# Patient Record
Sex: Male | Born: 1945 | Hispanic: No | Smoking: Never smoker
Health system: Southern US, Community
[De-identification: ages and names within clinical notes are randomized; demographics above are authoritative.]

## PROBLEM LIST (undated history)

## (undated) DIAGNOSIS — K219 Gastro-esophageal reflux disease without esophagitis: Secondary | ICD-10-CM

## (undated) DIAGNOSIS — I10 Essential (primary) hypertension: Secondary | ICD-10-CM

## (undated) DIAGNOSIS — T7840XA Allergy, unspecified, initial encounter: Secondary | ICD-10-CM

## (undated) HISTORY — DX: Allergy, unspecified, initial encounter: T78.40XA

## (undated) HISTORY — PX: OTHER SURGICAL HISTORY: SHX169

## (undated) HISTORY — PX: SHOULDER SURGERY: SHX246

## (undated) HISTORY — DX: Essential (primary) hypertension: I10

## (undated) HISTORY — DX: Gastro-esophageal reflux disease without esophagitis: K21.9

---

## 2005-12-21 ENCOUNTER — Ambulatory Visit (HOSPITAL_COMMUNITY): Admission: RE | Admit: 2005-12-21 | Discharge: 2005-12-22 | Payer: Self-pay | Admitting: Orthopedic Surgery

## 2006-05-12 ENCOUNTER — Ambulatory Visit (HOSPITAL_COMMUNITY): Admission: RE | Admit: 2006-05-12 | Discharge: 2006-05-12 | Payer: Self-pay | Admitting: Orthopedic Surgery

## 2011-08-19 ENCOUNTER — Ambulatory Visit: Payer: BC Managed Care – PPO

## 2011-08-19 ENCOUNTER — Ambulatory Visit (INDEPENDENT_AMBULATORY_CARE_PROVIDER_SITE_OTHER): Payer: BC Managed Care – PPO | Admitting: Internal Medicine

## 2011-08-19 VITALS — BP 175/91 | HR 76 | Temp 97.9°F | Resp 18 | Ht 64.5 in | Wt 199.0 lb

## 2011-08-19 DIAGNOSIS — M79609 Pain in unspecified limb: Secondary | ICD-10-CM

## 2011-08-19 DIAGNOSIS — S638X1A Sprain of other part of right wrist and hand, initial encounter: Secondary | ICD-10-CM

## 2011-08-19 DIAGNOSIS — M79641 Pain in right hand: Secondary | ICD-10-CM

## 2011-08-19 NOTE — Patient Instructions (Signed)
Splint ice elevate advil as directed

## 2011-08-19 NOTE — Progress Notes (Signed)
  Subjective:    Patient ID: John Stewart, male    DOB: 1945-12-24, 66 y.o.   MRN: [id number]  HPI Pain 5th digit and hand on the right side Onset yesterday Pulled by dog when he had the leash wrapped around his hand Pain 8/10 mod to severe radiates to hte right hand Minimal relief with ice and elevation Increase pain with range of motion    Review of Systems  Unable to perform ROS Constitutional: Negative.   HENT: Negative.   Eyes: Negative.   Respiratory: Negative.   Cardiovascular: Negative.   Gastrointestinal: Negative.   Genitourinary: Negative.   Musculoskeletal: Positive for joint swelling.       5th digit of the right hand  Skin: Negative.   Neurological: Negative.   Hematological: Negative.   Psychiatric/Behavioral: Negative.   All other systems reviewed and are negative.       Objective:   Physical Exam  Constitutional: He is oriented to person, place, and time. He appears well-developed and well-nourished.  HENT:  Head: Normocephalic and atraumatic.  Eyes: Conjunctivae and EOM are normal. Pupils are equal, round, and reactive to light.  Neck: Normal range of motion. Neck supple.  Cardiovascular: Normal rate, regular rhythm and normal heart sounds.   Pulmonary/Chest: Effort normal and breath sounds normal.  Abdominal: Soft.  Musculoskeletal: He exhibits tenderness.       Tender swelling mc-p joint o the 5th digit of the right hand Pain with motion.  Neurological: He is alert and oriented to person, place, and time.  Skin: Skin is warm and dry.  Psychiatric: He has a normal mood and affect. His behavior is normal. Judgment and thought content normal.      [facility] reading (PRIMARY) by  Dr. Mindi Junker .right hand xray negitive for fracture or dislocation     Assessment & Plan:  Will need to do xray of hte right hand to eval for possible fracture or dislocation.  Will buddy tape and splint rx antiinflammatory otc

## 2012-09-29 ENCOUNTER — Ambulatory Visit (INDEPENDENT_AMBULATORY_CARE_PROVIDER_SITE_OTHER): Payer: PRIVATE HEALTH INSURANCE | Admitting: Family Medicine

## 2012-09-29 VITALS — BP 142/90 | HR 80 | Temp 98.0°F | Resp 16 | Ht 65.5 in | Wt 214.0 lb

## 2012-09-29 DIAGNOSIS — R05 Cough: Secondary | ICD-10-CM

## 2012-09-29 DIAGNOSIS — T148 Other injury of unspecified body region: Secondary | ICD-10-CM

## 2012-09-29 DIAGNOSIS — W57XXXA Bitten or stung by nonvenomous insect and other nonvenomous arthropods, initial encounter: Secondary | ICD-10-CM

## 2012-09-29 DIAGNOSIS — R059 Cough, unspecified: Secondary | ICD-10-CM

## 2012-09-29 MED ORDER — HYDROCODONE-HOMATROPINE 5-1.5 MG/5ML PO SYRP: 5.0000 mL | ORAL_SOLUTION | ORAL | Status: DC | PRN

## 2012-09-29 MED ORDER — DOXYCYCLINE HYCLATE 100 MG PO TABS: 100.0000 mg | ORAL_TABLET | Freq: Two times a day (BID) | ORAL | Status: DC

## 2012-09-29 MED ORDER — BENZONATATE 100 MG PO CAPS: ORAL_CAPSULE | ORAL | Status: DC

## 2012-09-29 NOTE — Patient Instructions (Addendum)
Drink plenty of fluids  Get sufficient rest  Take the cough pills in the daytime and the cough syrup at night  Take the doxycycline one twice daily for a full 10 days for the tick bite and for the respiratory infection. Be cautious because on doxycycline you may sunburn easier. Return if worse

## 2012-09-29 NOTE — Progress Notes (Signed)
Subjective: Patient has had a respiratory tract infection since about Sunday or Monday. He has had cough with mild phlegm production. He's had some sore throat. The head is being congested also. His daughter has been ill, and his wife now has a little throat discomfort yesterday. He works at [employer]. He does not smoke. He rarely has to go see the doctor.  About 3 days ago he had a tick on his left upper thigh a in the crease of the groin. His wife removed it with him fingernail polish remover and then placing it out. He has a area of erythema about the shape of an egg where the tick was removed. He says it was a little brown tick.  Objective: Pleasant gentleman in no major distress. His TMs are normal. Throat clear. Neck supple without significant nodes. Chest is clear to auscultation. Heart regular without murmurs. He has an area of erythema from the tick bite on the left groin at the upper thigh.  Assessment: Tick bite with local cellulitis, too soon for other characteristic diseases  URI and bronchitis  Plan: Doxycycline 100 mg twice a day Tessalon and Hycodan Return for any further problem

## 2013-02-13 ENCOUNTER — Ambulatory Visit (INDEPENDENT_AMBULATORY_CARE_PROVIDER_SITE_OTHER): Payer: Medicare PPO | Admitting: Family Medicine

## 2013-02-13 VITALS — BP 132/90 | HR 78 | Temp 98.3°F | Resp 16 | Ht 65.5 in | Wt 218.4 lb

## 2013-02-13 DIAGNOSIS — Z23 Encounter for immunization: Secondary | ICD-10-CM

## 2013-02-13 DIAGNOSIS — K219 Gastro-esophageal reflux disease without esophagitis: Secondary | ICD-10-CM

## 2013-02-13 DIAGNOSIS — I10 Essential (primary) hypertension: Secondary | ICD-10-CM

## 2013-02-13 LAB — POCT UA - MICROSCOPIC ONLY
Bacteria, U Microscopic: NEGATIVE
Casts, Ur, LPF, POC: NEGATIVE
Crystals, Ur, HPF, POC: NEGATIVE
Epithelial cells, urine per micros: NEGATIVE
Yeast, UA: NEGATIVE

## 2013-02-13 LAB — BASIC METABOLIC PANEL
CO2: 24 mEq/L (ref 19–32)
Calcium: 9 mg/dL (ref 8.4–10.5)
Chloride: 105 mEq/L (ref 96–112)
Potassium: 3.8 mEq/L (ref 3.5–5.3)
Sodium: 139 mEq/L (ref 135–145)

## 2013-02-13 LAB — POCT URINALYSIS DIPSTICK
Bilirubin, UA: NEGATIVE
Glucose, UA: NEGATIVE
Ketones, UA: NEGATIVE
Leukocytes, UA: NEGATIVE
Nitrite, UA: NEGATIVE
Protein, UA: NEGATIVE
Spec Grav, UA: 1.015
Urobilinogen, UA: 0.2
pH, UA: 5.5

## 2013-02-13 LAB — BASIC METABOLIC PANEL WITH GFR
BUN: 10 mg/dL (ref 6–23)
Creat: 0.74 mg/dL (ref 0.50–1.35)
Glucose, Bld: 93 mg/dL (ref 70–99)

## 2013-02-13 MED ORDER — LISINOPRIL 40 MG PO TABS: 40.0000 mg | ORAL_TABLET | Freq: Every day | ORAL | Status: DC

## 2013-02-13 MED ORDER — OMEPRAZOLE 20 MG PO CPDR: 20.0000 mg | DELAYED_RELEASE_CAPSULE | Freq: Every day | ORAL | Status: DC

## 2013-02-13 MED ORDER — MUPIROCIN CALCIUM 2 % EX CREA: TOPICAL_CREAM | Freq: Two times a day (BID) | CUTANEOUS | Status: DC

## 2013-02-13 NOTE — Progress Notes (Signed)
[facility]:  Office Visit  Chief Complaint:  Chief Complaint  Patient presents with  . Medication Refills    Lisinopril and Omeprazole  . Rash    Lower left leg, X1-2 months    HPI: John Stewart is a 67 y.o. male who is here for:  1. HTN-dx 1-1.5 years ago, SBP 150s/90-100 but normally runs at 120/80s when on meds. He retired from [employer] and had 1 months worth of supply until Medicare kicked in, so he has been stretching out every other day.  He was trained as an Event organiser, prior to working at [employer]. He was there for 17 years. He was a Runner, broadcasting/film/video and AT for 22 years. No diabetes or high cholesterol that he is aware of. He has been on lisinopril without SEs  2. He has also a 2 week history of a rash on his left leg, he thinks it mught be from working out in the yard until he scratched it and it got more infected and  Has not healed. He denies Dm   Past Medical History  Diagnosis Date  . Hypertension    Past Surgical History  Procedure Laterality Date  . Biceps tendon surgery Right    History   Social History  . Marital Status: Married    Spouse Name: N/A    Number of Children: N/A  . Years of Education: N/A   Social History Main Topics  . Smoking status: Never Smoker   . Smokeless tobacco: None  . Alcohol Use: None  . Drug Use: None  . Sexual Activity: None   Other Topics Concern  . None   Social History Narrative  . None   History reviewed. No pertinent family history. No Known Allergies Prior to Admission medications   Medication Sig Start Date End Date Taking? Authorizing Provider  lisinopril (PRINIVIL,ZESTRIL) 40 MG tablet Take 40 mg by mouth daily.   Yes Historical Provider, MD  omeprazole (PRILOSEC) 20 MG capsule Take 20 mg by mouth daily.   Yes Historical Provider, MD  benzonatate (TESSALON) 100 MG capsule Use 1-2 tablets 3 times daily as necessary for cough. May be used with other cough medicines if needed.  09/29/12   Peyton Najjar, MD  doxycycline (VIBRA-TABS) 100 MG tablet Take 1 tablet (100 mg total) by mouth 2 (two) times daily. 09/29/12   Peyton Najjar, MD  HYDROcodone-homatropine [facility]) 5-1.5 MG/5ML syrup Take 5 mLs by mouth every 4 (four) hours as needed for cough. 09/29/12   Peyton Najjar, MD     ROS: The patient denies fevers, chills, night sweats, unintentional weight loss, chest pain, palpitations, wheezing, dyspnea on exertion, nausea, vomiting, abdominal pain, dysuria, hematuria, melena, numbness, weakness, or tingling.   All other systems have been reviewed and were otherwise negative with the exception of those mentioned in the HPI and as above.    PHYSICAL EXAM: Filed Vitals:   02/13/13 1503  BP: 132/90  Pulse: 78  Temp: 98.3 F (36.8 C)  Resp: 16   Filed Vitals:   02/13/13 1503  Height: 5' 5.5" (1.664 m)  Weight: 218 lb 6.4 oz (99.066 kg)   Body mass index is 35.78 kg/(m^2).  General: Alert, no acute distress HEENT:  Normocephalic, atraumatic, oropharynx patent. EOMI, PERRLA Cardiovascular:  Regular rate and rhythm, no rubs murmurs or gallops.  No Carotid bruits, radial pulse intact. No pedal edema.  Respiratory: Clear to auscultation bilaterally.  No wheezes, rales, or rhonchi.  No  cyanosis, no use of accessory musculature GI: No organomegaly, abdomen is soft and non-tender, positive bowel sounds.  No masses. Skin: + excoriated macular rash x 2 lesions on left anterior shin Lower leg Neurologic: Facial musculature symmetric. Psychiatric: Patient is appropriate throughout our interaction. Lymphatic: No cervical lymphadenopathy Musculoskeletal: Gait intact.   LABS: Results for orders placed in visit on 02/13/13  BASIC METABOLIC PANEL      Result Value Range   Sodium 139  135 - 145 mEq/L   Potassium 3.8  3.5 - 5.3 mEq/L   Chloride 105  96 - 112 mEq/L   CO2 24  19 - 32 mEq/L   Glucose, Bld 93  70 - 99 mg/dL   BUN 10  6 - 23 mg/dL   Creat 2.13  0.86 - 5.78  mg/dL   Calcium 9.0  8.4 - 46.9 mg/dL  POCT UA - MICROSCOPIC ONLY      Result Value Range   WBC, Ur, HPF, POC 0-1     RBC, urine, microscopic 0-2     Bacteria, U Microscopic neg     Mucus, UA trace     Epithelial cells, urine per micros neg     Crystals, Ur, HPF, POC neg     Casts, Ur, LPF, POC neg     Yeast, UA neg    POCT URINALYSIS DIPSTICK      Result Value Range   Color, UA yellow     Clarity, UA clear     Glucose, UA neg     Bilirubin, UA neg     Ketones, UA neg     Spec Grav, UA 1.015     Blood, UA small     pH, UA 5.5     Protein, UA neg     Urobilinogen, UA 0.2     Nitrite, UA neg     Leukocytes, UA Negative       EKG/XRAY:   Primary read interpreted by Dr. Conley Rolls at [facility].   ASSESSMENT/PLAN: Encounter Diagnoses  Name Primary?  . HTN (hypertension) Yes  . GERD (gastroesophageal reflux disease)   . Flu vaccine need    Topical bactroban cream BID and wound care as directed Flu vaccine given  Refilled meds for HTN and GERD: Lisinopril 40 mg daily, Prilosec daily F/u in 6 months, annual exam, with Drs Clelia Croft, Neva Seat or [facility] pending Gross sideeffects, risk and benefits, and alternatives of medications d/w patient. Patient is aware that all medications have potential sideeffects and we are unable to predict every sideeffect or drug-drug interaction that may occur.  LE, THAO PHUONG, DO 02/14/2013 1:23 PM

## 2014-01-15 ENCOUNTER — Other Ambulatory Visit: Payer: Self-pay | Admitting: Family Medicine

## 2014-01-16 ENCOUNTER — Ambulatory Visit (INDEPENDENT_AMBULATORY_CARE_PROVIDER_SITE_OTHER): Payer: Medicare PPO | Admitting: Family Medicine

## 2014-01-16 VITALS — BP 142/78 | HR 81 | Temp 98.1°F | Resp 18 | Ht 65.0 in | Wt 217.0 lb

## 2014-01-16 DIAGNOSIS — S81802A Unspecified open wound, left lower leg, initial encounter: Secondary | ICD-10-CM

## 2014-01-16 DIAGNOSIS — I10 Essential (primary) hypertension: Secondary | ICD-10-CM

## 2014-01-16 MED ORDER — MUPIROCIN CALCIUM 2 % EX CREA: TOPICAL_CREAM | Freq: Three times a day (TID) | CUTANEOUS | Status: DC

## 2014-01-16 MED ORDER — LISINOPRIL 40 MG PO TABS: 40.0000 mg | ORAL_TABLET | Freq: Every day | ORAL | Status: DC

## 2014-01-16 NOTE — Progress Notes (Signed)
Subjective:    Patient ID: John Stewart, male    DOB: June 13, 1945, 68 y.o.   MRN: [id number]  This chart was scribed for John Agreste, MD at [facility] by Rayfield Citizen, medical scribe. This patient was seen in room Room 11 and the patient's care was started at 4:44 PM.   HPI  HPI Comments: John Stewart is a 68 y.o. male who presents to the [facility] for medication refill; last seen by Dr. Marin Comment November of 2014. Medication was prescribed then with plan of follow up in 6 mos. with Dr. Carlota Raspberry, Dr. Leward Quan, or Dr. Brigitte Pulse. Normal electrolytes at that time with creatinine 0.74. He had his omeprazole for heartburn refilled at that time as well.   He would like his blood pressure medication refilled today; he received a call from [facility] to pick up his blood pressure meds. He then went to [facility] and was told that an MD had to call to have it refilled, prompting him to come to [facility] this afternoon. He states that he has been taking this medication off and on; he took his last dose yesterday (40mg ) and his blood pressure was slightly elevated today (142/78). He has a blood pressure cuff at home and this number is normal for him (140s/70s sometimes as high as 90s). He estimates that he misses his medications 3-4x a month. He denies chest pain or SOB, denies any abdominal pain or blood in the stool. He denies any side effects from his medication. He does not exercise regularly; he walks the dog .5 mi.   He also notes some rash/skin breakdown on his left shin for the past three months. He has been utilizing OTC antibiotic and anti-itch ointments with minimal relief, though he reports that it is gradually improving. He denies any insect bites but notes that he does garden occasionally. He reports that he does scratch at the area.    He does not have a PCP; he normally sees a physician at [facility]. He is amenable to scheduling a physical.   He is recently retired.    There are no active problems to display for this patient.  Past Medical History  Diagnosis Date  . Hypertension    Past Surgical History  Procedure Laterality Date  . Biceps tendon surgery Right    No Known Allergies Prior to Admission medications   Medication Sig Start Date End Date Taking? Authorizing Provider  lisinopril (PRINIVIL,ZESTRIL) 40 MG tablet Take 1 tablet (40 mg total) by mouth daily. PATIENT NEEDS AN OFFICE VISIT FOR ADDITIONAL REFILLS. 01/15/14  Yes Thao P Le, DO  omeprazole (PRILOSEC) 20 MG capsule Take 1 capsule (20 mg total) by mouth daily. 02/13/13  Yes Thao P Le, DO   History   Social History  . Marital Status: Married    Spouse Name: N/A    Number of Children: N/A  . Years of Education: N/A   Occupational History  . Not on file.   Social History Main Topics  . Smoking status: Never Smoker   . Smokeless tobacco: Not on file  . Alcohol Use: Not on file  . Drug Use: Not on file  . Sexual Activity: Not on file   Other Topics Concern  . Not on file   Social History Narrative  . No narrative on file    Review of Systems  Constitutional: Negative for fatigue and unexpected weight change.  Eyes: Negative for visual disturbance.  Respiratory: Negative for cough, chest tightness and shortness of breath.   Cardiovascular: Negative for chest pain, palpitations and leg swelling.  Gastrointestinal: Negative for abdominal pain and blood in stool.  Skin: Positive for rash.  Neurological: Negative for dizziness, light-headedness and headaches.       Objective:   Physical Exam  Vitals reviewed. Constitutional: He is oriented to person, place, and time. He appears well-developed and well-nourished.  HENT:  Head: Normocephalic and atraumatic.  Eyes: EOM are normal. Pupils are equal, round, and reactive to light.  Neck: No JVD present. Carotid bruit is not present.  Cardiovascular: Normal rate, regular rhythm and normal heart sounds.   No murmur  heard. Pulmonary/Chest: Effort normal and breath sounds normal. He has no rales.  Musculoskeletal: He exhibits no edema.  Slight edema to the anterior LLE with some raw appearing excorited areas medially. Minimal surrounding erythema. No discharge.   Neurological: He is alert and oriented to person, place, and time.  Skin: Skin is warm and dry.  Psychiatric: He has a normal mood and affect.    Filed Vitals:   01/16/14 1541  BP: 142/78  Pulse: 81  Temp: 98.1 F (36.7 C)  TempSrc: Oral  Resp: 18  Height: 5\' 5"  (1.651 m)  Weight: 217 lb (98.431 kg)  SpO2: 98%       Assessment & Plan:   John Stewart is a 68 y.o. male Essential hypertension - Plan: Basic metabolic panel, lisinopril (PRINIVIL,ZESTRIL) 40 MG tablet  - same doses refilled. Labs pending.  Cut back on salt intake. Check ambulatory BP's and call if elevated.   Multiple open wounds of lower leg, left, initial encounter - Plan: mupirocin cream (BACTROBAN) 2 %  -excoriated wounds of legs, suspected due to insect bites.  Topical hydrocortisone if needed, and bactroban to open wounds.   Meds ordered this encounter  Medications  . lisinopril (PRINIVIL,ZESTRIL) 40 MG tablet    Sig: Take 1 tablet (40 mg total) by mouth daily.    Dispense:  90 tablet    Refill:  1  . mupirocin cream (BACTROBAN) 2 %    Sig: Apply topically 3 (three) times daily. To affected areas on legs.    Dispense:  30 g    Refill:  0   Patient Instructions  You should receive a call or letter about your lab results within the next week to 10 days.  Cut back on salt in diet - frozen foods, restaurant foods are common source.  Keep a record of your blood pressures outside of the office and bring them to the next office visit. If remaining over 140/90 - let me know, as may need additional blood pressure medicine.   bactroban oitnment to open areas on legs, avoid scratching (over the counter hydrocortisone if needed for itching). Return to the clinic or  go to the nearest emergency room if any of your symptoms worsen or new symptoms occur.      I personally performed the services described in this documentation, which was scribed in my presence. The recorded information has been reviewed and considered, and addended by me as needed.

## 2014-01-16 NOTE — Patient Instructions (Signed)
You should receive a call or letter about your lab results within the next week to 10 days.  Cut back on salt in diet - frozen foods, restaurant foods are common source.  Keep a record of your blood pressures outside of the office and bring them to the next office visit. If remaining over 140/90 - let me know, as may need additional blood pressure medicine.   bactroban oitnment to open areas on legs, avoid scratching (over the counter hydrocortisone if needed for itching). Return to the clinic or go to the nearest emergency room if any of your symptoms worsen or new symptoms occur.

## 2014-01-17 LAB — BASIC METABOLIC PANEL
BUN: 14 mg/dL (ref 6–23)
CHLORIDE: 103 meq/L (ref 96–112)
CO2: 28 meq/L (ref 19–32)
Calcium: 9.4 mg/dL (ref 8.4–10.5)
Creat: 0.99 mg/dL (ref 0.50–1.35)
GLUCOSE: 98 mg/dL (ref 70–99)
Potassium: 4.1 mEq/L (ref 3.5–5.3)
Sodium: 139 mEq/L (ref 135–145)

## 2014-01-23 NOTE — Progress Notes (Signed)
LMOM for pt to call the ofc to sche a 6 months CPE with Dr. Carlota Raspberry or a provider who is accepting new MCR cpe.

## 2014-01-24 ENCOUNTER — Encounter: Payer: Self-pay | Admitting: Radiology

## 2014-02-12 NOTE — Progress Notes (Signed)
Spoke to patient.  He is on his way to [place] and will call to schedule an appointment with Dr. Nyoka Cowden for a physical in 6 months when he returns.

## 2014-02-16 ENCOUNTER — Telehealth: Payer: Self-pay | Admitting: Family Medicine

## 2014-02-16 NOTE — Telephone Encounter (Signed)
-----   Message from [name], MD sent at 01/16/2014  5:03 PM EDT ----- cpe in next 6 months with Carlota Raspberry or other provider accepting new MCR patients.

## 2014-02-16 NOTE — Telephone Encounter (Signed)
LMOM to CB. 

## 2014-02-19 NOTE — Telephone Encounter (Signed)
-----   Message from [name], MD sent at 01/16/2014  5:03 PM EDT ----- cpe in next 6 months with Carlota Raspberry or other provider accepting new MCR patients.

## 2014-02-26 ENCOUNTER — Telehealth: Payer: Self-pay

## 2014-02-26 NOTE — Telephone Encounter (Signed)
-----   Message from [name], MD sent at 01/16/2014  5:03 PM EDT ----- cpe in next 6 months with Carlota Raspberry or other provider accepting new MCR patients.

## 2014-02-26 NOTE — Progress Notes (Signed)
Patient's scheduled for CPE appointment on 08/20/14.

## 2014-06-13 ENCOUNTER — Telehealth: Payer: Self-pay

## 2014-07-05 NOTE — Telephone Encounter (Signed)
No message

## 2014-07-31 ENCOUNTER — Telehealth: Payer: Self-pay

## 2014-07-31 ENCOUNTER — Ambulatory Visit (INDEPENDENT_AMBULATORY_CARE_PROVIDER_SITE_OTHER): Payer: Commercial Managed Care - HMO | Admitting: Emergency Medicine

## 2014-07-31 VITALS — BP 150/80 | HR 68 | Temp 98.2°F | Resp 20 | Ht 65.5 in | Wt 213.2 lb

## 2014-07-31 DIAGNOSIS — I10 Essential (primary) hypertension: Secondary | ICD-10-CM | POA: Diagnosis not present

## 2014-07-31 DIAGNOSIS — M5432 Sciatica, left side: Secondary | ICD-10-CM | POA: Diagnosis not present

## 2014-07-31 MED ORDER — LISINOPRIL 40 MG PO TABS: 40.0000 mg | ORAL_TABLET | Freq: Every day | ORAL | Status: DC

## 2014-07-31 MED ORDER — OMEPRAZOLE 20 MG PO CPDR: 20.0000 mg | DELAYED_RELEASE_CAPSULE | Freq: Every day | ORAL | Status: DC

## 2014-07-31 MED ORDER — NAPROXEN SODIUM 550 MG PO TABS: 550.0000 mg | ORAL_TABLET | Freq: Two times a day (BID) | ORAL | Status: DC

## 2014-07-31 MED ORDER — HYDROCHLOROTHIAZIDE 25 MG PO TABS: 25.0000 mg | ORAL_TABLET | Freq: Every day | ORAL | Status: DC

## 2014-07-31 NOTE — Progress Notes (Signed)
[facility] [address] [place] [address] [phone]  Date:  07/31/2014   Name:  John Stewart   DOB:  [birth year]   MRN:  [id number]  PCP:  No PCP Per Patient    Chief Complaint: Medication Refill and Sciatica   History of Present Illness:  John Stewart is a 69 y.o. very pleasant male patient who presents with the following:  History of hypertension. Out of medications.  Last pill yesterday and has been skip dosing No end organ injury No chest pain, shortness of breath No edema Now has three month history of pain in left leg down back of left thigh and occasionally into calf Worse with standing and walking. Less with sitting and laying No numbness, tingling, or weakness No injury or overuse No improvement with over the counter medications or other home remedies.  Denies other complaint or health concern today.   There are no active problems to display for this patient.   Past Medical History  Diagnosis Date  . Hypertension     Past Surgical History  Procedure Laterality Date  . Biceps tendon surgery Right     History  Substance Use Topics  . Smoking status: Never Smoker   . Smokeless tobacco: Not on file  . Alcohol Use: Not on file    History reviewed. No pertinent family history.  No Known Allergies  Medication list has been reviewed and updated.  Current Outpatient Prescriptions on File Prior to Visit  Medication Sig Dispense Refill  . lisinopril (PRINIVIL,ZESTRIL) 40 MG tablet Take 1 tablet (40 mg total) by mouth daily. 90 tablet 1  . omeprazole (PRILOSEC) 20 MG capsule Take 1 capsule (20 mg total) by mouth daily. 90 capsule 1   No current facility-administered medications on file prior to visit.    Review of Systems:  As per HPI, otherwise negative.    Physical Examination: Filed Vitals:   07/31/14 1120  BP: 150/80  Pulse: 68  Temp: 98.2 F (36.8 C)  Resp: 20   Filed Vitals:   07/31/14 1120  Height:  5' 5.5" (1.664 m)  Weight: 213 lb 3 oz (96.701 kg)   Body mass index is 34.92 kg/(m^2). Ideal Body Weight: Weight in (lb) to have BMI = 25: 152.2  GEN: obese, NAD, Non-toxic, A & O x 3 HEENT: Atraumatic, Normocephalic. Neck supple. No masses, No LAD. Ears and Nose: No external deformity. CV: RRR, No M/G/R. No JVD. No thrill. No extra heart sounds. PULM: CTA B, no wheezes, crackles, rhonchi. No retractions. No resp. distress. No accessory muscle use. ABD: S, NT, ND, +BS. No rebound. No HSM. EXTR: No c/c/e NEURO Normal gait.  PSYCH: Normally interactive. Conversant. Not depressed or anxious appearing.  Calm demeanor.    Assessment and Plan: Hypertension Add HCTZ Sciatic neuritis Anaprox MRI   Signed,  Ellison Carwin, MD

## 2014-07-31 NOTE — Telephone Encounter (Signed)
Pt came back into 102 stating that he needed RFs of his lisinopril and omeprazole in addition to the 2 new Rxs Dr Ouida Sills just sent in to pharm. I checked w/Dr Ouida Sills, who also OKd RFs of these to meds that were on pt's current med list. Notified pt done.

## 2014-07-31 NOTE — Patient Instructions (Signed)
Sciatica Sciatica is pain, weakness, numbness, or tingling along the path of the sciatic nerve. The nerve starts in the lower back and runs down the back of each leg. The nerve controls the muscles in the lower leg and in the back of the knee, while also providing sensation to the back of the thigh, lower leg, and the sole of your foot. Sciatica is a symptom of another medical condition. For instance, nerve damage or certain conditions, such as a herniated disk or bone spur on the spine, pinch or put pressure on the sciatic nerve. This causes the pain, weakness, or other sensations normally associated with sciatica. Generally, sciatica only affects one side of the body. CAUSES   Herniated or slipped disc.  Degenerative disk disease.  A pain disorder involving the narrow muscle in the buttocks (piriformis syndrome).  Pelvic injury or fracture.  Pregnancy.  Tumor (rare). SYMPTOMS  Symptoms can vary from mild to very severe. The symptoms usually travel from the low back to the buttocks and down the back of the leg. Symptoms can include:  Mild tingling or dull aches in the lower back, leg, or hip.  Numbness in the back of the calf or sole of the foot.  Burning sensations in the lower back, leg, or hip.  Sharp pains in the lower back, leg, or hip.  Leg weakness.  Severe back pain inhibiting movement. These symptoms may get worse with coughing, sneezing, laughing, or prolonged sitting or standing. Also, being overweight may worsen symptoms. DIAGNOSIS  Your caregiver will perform a physical exam to look for common symptoms of sciatica. He or she may ask you to do certain movements or activities that would trigger sciatic nerve pain. Other tests may be performed to find the cause of the sciatica. These may include:  Blood tests.  X-rays.  Imaging tests, such as an MRI or CT scan. TREATMENT  Treatment is directed at the cause of the sciatic pain. Sometimes, treatment is not necessary  and the pain and discomfort goes away on its own. If treatment is needed, your caregiver may suggest:  Over-the-counter medicines to relieve pain.  Prescription medicines, such as anti-inflammatory medicine, muscle relaxants, or narcotics.  Applying heat or ice to the painful area.  Steroid injections to lessen pain, irritation, and inflammation around the nerve.  Reducing activity during periods of pain.  Exercising and stretching to strengthen your abdomen and improve flexibility of your spine. Your caregiver may suggest losing weight if the extra weight makes the back pain worse.  Physical therapy.  Surgery to eliminate what is pressing or pinching the nerve, such as a bone spur or part of a herniated disk. HOME CARE INSTRUCTIONS   Only take over-the-counter or prescription medicines for pain or discomfort as directed by your caregiver.  Apply ice to the affected area for 20 minutes, 3-4 times a day for the first 48-72 hours. Then try heat in the same way.  Exercise, stretch, or perform your usual activities if these do not aggravate your pain.  Attend physical therapy sessions as directed by your caregiver.  Keep all follow-up appointments as directed by your caregiver.  Do not wear high heels or shoes that do not provide proper support.  Check your mattress to see if it is too soft. A firm mattress may lessen your pain and discomfort. SEEK IMMEDIATE MEDICAL CARE IF:   You lose control of your bowel or bladder (incontinence).  You have increasing weakness in the lower back, pelvis, buttocks,   or legs.  You have redness or swelling of your back.  You have a burning sensation when you urinate.  You have pain that gets worse when you lie down or awakens you at night.  Your pain is worse than you have experienced in the past.  Your pain is lasting longer than 4 weeks.  You are suddenly losing weight without reason. MAKE SURE YOU:  Understand these  instructions.  Will watch your condition.  Will get help right away if you are not doing well or get worse. Document Released: 03/24/2001 Document Revised: 09/29/2011 Document Reviewed: 08/09/2011 Exit[employer] Patient Information 2015 [employer]. This information is not intended to replace advice given to you by your health care provider. Make sure you discuss any questions you have with your health care provider.  

## 2014-08-20 ENCOUNTER — Encounter: Payer: Self-pay | Admitting: Family Medicine

## 2014-08-20 ENCOUNTER — Ambulatory Visit (INDEPENDENT_AMBULATORY_CARE_PROVIDER_SITE_OTHER): Payer: Commercial Managed Care - HMO | Admitting: Family Medicine

## 2014-08-20 ENCOUNTER — Ambulatory Visit: Payer: Commercial Managed Care - HMO | Admitting: Family Medicine

## 2014-08-20 VITALS — BP 154/89 | HR 65 | Temp 98.3°F | Resp 16 | Ht 65.0 in | Wt 209.0 lb

## 2014-08-20 DIAGNOSIS — D2239 Melanocytic nevi of other parts of face: Secondary | ICD-10-CM

## 2014-08-20 DIAGNOSIS — M5432 Sciatica, left side: Secondary | ICD-10-CM

## 2014-08-20 DIAGNOSIS — I10 Essential (primary) hypertension: Secondary | ICD-10-CM

## 2014-08-20 DIAGNOSIS — Z8042 Family history of malignant neoplasm of prostate: Secondary | ICD-10-CM | POA: Diagnosis not present

## 2014-08-20 DIAGNOSIS — D2230 Melanocytic nevi of unspecified part of face: Secondary | ICD-10-CM

## 2014-08-20 DIAGNOSIS — Z130 Encounter for screening for diseases of the blood and blood-forming organs and certain disorders involving the immune mechanism: Secondary | ICD-10-CM

## 2014-08-20 DIAGNOSIS — Z0000 Encounter for general adult medical examination without abnormal findings: Secondary | ICD-10-CM

## 2014-08-20 DIAGNOSIS — Z23 Encounter for immunization: Secondary | ICD-10-CM

## 2014-08-20 DIAGNOSIS — Z1211 Encounter for screening for malignant neoplasm of colon: Secondary | ICD-10-CM | POA: Diagnosis not present

## 2014-08-20 DIAGNOSIS — R351 Nocturia: Secondary | ICD-10-CM

## 2014-08-20 DIAGNOSIS — Z125 Encounter for screening for malignant neoplasm of prostate: Secondary | ICD-10-CM

## 2014-08-20 DIAGNOSIS — Z13220 Encounter for screening for lipoid disorders: Secondary | ICD-10-CM | POA: Diagnosis not present

## 2014-08-20 DIAGNOSIS — R3989 Other symptoms and signs involving the genitourinary system: Secondary | ICD-10-CM

## 2014-08-20 LAB — COMPLETE METABOLIC PANEL WITH GFR
ALK PHOS: 74 U/L (ref 39–117)
ALT: 38 U/L (ref 0–53)
AST: 33 U/L (ref 0–37)
Albumin: 4.1 g/dL (ref 3.5–5.2)
BILIRUBIN TOTAL: 0.7 mg/dL (ref 0.2–1.2)
BUN: 15 mg/dL (ref 6–23)
CO2: 27 mEq/L (ref 19–32)
CREATININE: 0.94 mg/dL (ref 0.50–1.35)
Calcium: 9.6 mg/dL (ref 8.4–10.5)
Chloride: 103 mEq/L (ref 96–112)
GFR, Est African American: >89 mL/min
GFR, Est Non African American: 83 mL/min
Glucose, Bld: 80 mg/dL (ref 70–99)
Potassium: 4.5 mEq/L (ref 3.5–5.3)
Sodium: 138 mEq/L (ref 135–145)
Total Protein: 7.1 g/dL (ref 6.0–8.3)

## 2014-08-20 LAB — LIPID PANEL
CHOL/HDL RATIO: 3.3 ratio
CHOLESTEROL: 224 mg/dL — AB (ref 0–200)
HDL: 68 mg/dL (ref >=40)
LDL Cholesterol: 141 mg/dL — ABNORMAL HIGH (ref 0–99)
Triglycerides: 75 mg/dL (ref <150)
VLDL: 15 mg/dL (ref 0–40)

## 2014-08-20 LAB — CBC
HEMATOCRIT: 45.3 % (ref 39.0–52.0)
HEMOGLOBIN: 15.5 g/dL (ref 13.0–17.0)
MCH: 31.1 pg (ref 26.0–34.0)
MCHC: 34.2 g/dL (ref 30.0–36.0)
MCV: 91 fL (ref 78.0–100.0)
MPV: 9.7 fL (ref 8.6–12.4)
Platelets: 306 10*3/uL (ref 150–400)
RBC: 4.98 MIL/uL (ref 4.22–5.81)
RDW: 13.1 % (ref 11.5–15.5)
WBC: 5.7 10*3/uL (ref 4.0–10.5)

## 2014-08-20 MED ORDER — ZOSTER VACCINE LIVE 19400 UNT/0.65ML ~~LOC~~ SOLR: 0.6500 mL | Freq: Once | SUBCUTANEOUS | Status: DC

## 2014-08-20 MED ORDER — PREDNISONE 20 MG PO TABS: ORAL_TABLET | ORAL | Status: DC

## 2014-08-20 NOTE — Patient Instructions (Addendum)
Keep a record of your blood pressures outside of the office and if running over 140/90 - call me as you may need additional medicine if remains elevated. Make sure you take your medicines every day.  Continue the low salt diet and activity/exercise most days of the week (150 minutes minimum per week).   Stop the naprosyn. We can try a round of prednisone to see if this will help the sciatica, but if not helping in next 2 weeks - will need MRI and possibly back or neuro specialist.  Return to the clinic or go to the nearest emergency room if any of your symptoms worsen or new symptoms occur.  Call eye care provider to get an appointment for your eyes.   I will refer you to dermatologist for area on face and gastroenterologist for colon cancer screening.   I will also refer you to urologist to recheck the prostate, and discuss the nighttime urination  Return to the clinic or go to the nearest emergency room if any of your symptoms worsen or new symptoms occur.  Keeping you healthy  Get these tests  Blood pressure- Have your blood pressure checked once a year by your healthcare provider.  Normal blood pressure is 120/80  Weight- Have your body mass index (BMI) calculated to screen for obesity.  BMI is a measure of body fat based on height and weight. You can also calculate your own BMI at ViewBanking.[email or web address].  Cholesterol- Have your cholesterol checked every year.  Diabetes- Have your blood sugar checked regularly if you have high blood pressure, high cholesterol, have a family history of diabetes or if you are overweight.  Screening for Colon Cancer- Colonoscopy starting at age 61.  Screening may begin sooner depending on your family history and other health conditions. Follow up colonoscopy as directed by your Gastroenterologist.  Screening for Prostate Cancer- Both blood work (PSA) and a rectal exam help screen for Prostate Cancer.  Screening begins at age 19 with African-American  men and at age 81 with Caucasian men.  Screening may begin sooner depending on your family history.  Take these medicines  Aspirin- One aspirin daily can help prevent Heart disease and Stroke.  Flu shot- Every fall.  Tetanus- Every 10 years.  Zostavax- Once after the age of 39 to prevent Shingles.  Pneumonia shot- Once after the age of 57; if you are younger than 56, ask your healthcare provider if you need a Pneumonia shot.  Take these steps  Don't smoke- If you do smoke, talk to your doctor about quitting.  For tips on how to quit, go to www.smokefree.gov or call 1-800-QUIT-NOW.  Be physically active- Exercise 5 days a week for at least 30 minutes.  If you are not already physically active start slow and gradually work up to 30 minutes of moderate physical activity.  Examples of moderate activity include walking briskly, mowing the yard, dancing, swimming, bicycling, etc.  Eat a healthy diet- Eat a variety of healthy food such as fruits, vegetables, low fat milk, low fat cheese, yogurt, lean meant, poultry, fish, beans, tofu, etc. For more information go to www.thenutritionsource.org  Drink alcohol in moderation- Limit alcohol intake to less than two drinks a day. Never drink and drive.  Dentist- Brush and floss twice daily; visit your dentist twice a year.  Depression- Your emotional health is as important as your physical health. If you're feeling down, or losing interest in things you would normally enjoy please talk to your healthcare provider.  Eye exam- Visit your eye doctor every year.  Safe sex- If you may be exposed to a sexually transmitted infection, use a condom.  Seat belts- Seat belts can save your life; always wear one.  Smoke/Carbon Monoxide detectors- These detectors need to be installed on the appropriate level of your home.  Replace batteries at least once a year.  Skin cancer- When out in the sun, cover up and use sunscreen 15 SPF or higher.  Violence- If  anyone is threatening you, please tell your healthcare provider.  Living Will/ Health care power of attorney- Speak with your healthcare provider and family - as we discussed - work on this and let me know of your wishes as well.

## 2014-08-20 NOTE — Progress Notes (Signed)
Subjective:  This chart was scribed for Merri Ray, MD by [facility], medical scribe at [facility].The patient was seen in exam room 28 and the patient's care was started at 1:47 PM.   Patient ID: John Stewart, male    DOB: 12-17-1945, 69 y.o.   MRN: [id number] Chief Complaint  Patient presents with  . Annual Exam  . Back Pain    thinks it is his sciatic pain on the left side down into the hip and into the left leg   HPI HPI Comments: TIELER COURNOYER is a 69 y.o. male who presents to [facility] for an annual exam. He has been retired for several years.   Back pain:  Last seen on 07/31/2014 by Dr. Ouida Sills for left sided sciatic for three months. Treated with anaprox and MRI was ordered but this does not appear it has been done yet. He has not been called for the MRI. He takes the anaprox twice a day but this has not helped. Pt states his pain has slightly worsened since the April visit. He has pain with certain twisting movements. No history of peptic ulcers. He denies urine or bowel incontinence, or peritoneal numbness.   Hypertension: He takes lisinopril 40 mg daily and HCTZ 25 mg daily, no recent lipid panel on file. Blood pressure was elevated at 150/80 at Dr. Tonette Bihari visit and 142/78 at my visit in October 2015. Advised to cut back salt intake at October visit. Weight today is down from last two visits. He is fasting today but did not take his blood pressure medication today. Blood pressure reading are typically around  125/85 to 135/90-95. Very seldomly misses a dose. He works in the yard daily. Wt Readings from Last 3 Encounters:  08/20/14 209 lb (94.802 kg)  07/31/14 213 lb 3 oz (96.701 kg)  01/16/14 217 lb (98.431 kg)   Lab Results  Component Value Date   CREATININE 0.99 01/16/2014   Cancer screening: No colonoscopy on file. He believes his last colonoscopy was about 10 years ago. Will refer make referral. No black tarry  stools.   Prostate cancer screening:  No PSA on file, last PSA was 5-6 years ago which was normal. His father passed away to prostate cancer at 52. He does experience nocturia 3-4 episodes per night over the past 3-4 years. No dysuria, no recent changes.  Skin Cancer:  No family history of skin cancer. He is concerned about a spot on his face which has been present for several months.   Immunizations: He has not had the shingles vaccine. Immunization History  Administered Date(s) Administered  . Influenza,inj,Quad PF,36+ Mos 02/13/2013  . Tdap 04/15/2007   Depression screening:  Depression screen PHQ 2/9 08/20/2014  Decreased Interest 0  Down, Depressed, Hopeless 0  PHQ - 2 Score 0   Fall screening:  One fall in the past year. While working in the yard two weeks ago  Functional status screening:  Normal no positive findings.  Advanced Directives:  Exercise:  Works in the yard daily and walks his dogs around a half mile block near his home.  Dentist:  He has not seen by a dentist this year  Eye/Optho:  He has not been seen by ophthalmologist   Hearing: No difficulty   Aspirin Use: He takes an 81 mg aspirin daily.  There are no active problems to display for this patient.  Past Medical History  Diagnosis Date  .  Hypertension   . Acid reflux    Past Surgical History  Procedure Laterality Date  . Biceps tendon surgery Right   . Shoulder surgery     No Known Allergies Prior to Admission medications   Medication Sig Start Date End Date Taking? Authorizing Provider  hydrochlorothiazide (HYDRODIURIL) 25 MG tablet Take 1 tablet (25 mg total) by mouth daily. 07/31/14  Yes Roselee Culver, MD  lisinopril (PRINIVIL,ZESTRIL) 40 MG tablet Take 1 tablet (40 mg total) by mouth daily. 07/31/14  Yes Roselee Culver, MD  naproxen sodium (ANAPROX DS) 550 MG tablet Take 1 tablet (550 mg total) by mouth 2 (two) times daily with a meal. 07/31/14 07/31/15 Yes Roselee Culver,  MD  omeprazole (PRILOSEC) 20 MG capsule Take 1 capsule (20 mg total) by mouth daily. 07/31/14  Yes Roselee Culver, MD   History   Social History  . Marital Status: Married    Spouse Name: N/A  . Number of Children: N/A  . Years of Education: N/A   Occupational History  . Not on file.   Social History Main Topics  . Smoking status: Never Smoker   . Smokeless tobacco: Not on file  . Alcohol Use: 1.2 oz/week    0 Standard drinks or equivalent, 2 Cans of beer per week  . Drug Use: No  . Sexual Activity: Not on file   Other Topics Concern  . Not on file   Social History Narrative   Review of Systems  Constitutional: Negative for fatigue and unexpected weight change.  Eyes: Negative for visual disturbance.  Respiratory: Negative for cough, chest tightness and shortness of breath.   Cardiovascular: Negative for chest pain, palpitations and leg swelling.  Gastrointestinal: Negative for abdominal pain and blood in stool.  Neurological: Negative for dizziness, light-headedness and headaches.  13 point ROS reviewed in patient survey, negative other than listed above or in reviewed nursing note.    Objective:  BP 154/89 mmHg  Pulse 65  Temp(Src) 98.3 F (36.8 C) (Oral)  Resp 16  Ht 5\' 5"  (1.651 m)  Wt 209 lb (94.802 kg)  BMI 34.78 kg/m2  SpO2 98%   Visual Acuity Screening   Right eye Left eye Both eyes  Without correction:     With correction: 20/25 20/40 20/25   Physical Exam  Constitutional: He is oriented to person, place, and time. He appears well-developed and well-nourished. No distress.  HENT:  Head: Normocephalic and atraumatic.  Eyes: EOM are normal. Pupils are equal, round, and reactive to light.  Neck: Normal range of motion. No JVD present. Carotid bruit is not present.  Cardiovascular: Normal rate, regular rhythm and normal heart sounds.   No murmur heard. Pulmonary/Chest: Effort normal and breath sounds normal. No respiratory distress. He has no rales.    Abdominal: Soft. Bowel sounds are normal. He exhibits no distension. There is no tenderness. There is no rebound and no guarding.  Genitourinary: Prostate is not tender.  Prostate firm/possible nodular area left lobe and somewhat enlarged by exam.  Nontender.   Musculoskeletal: Normal range of motion. He exhibits no edema.  Positive straight leg raise on left with pain radiating to the calf. Full hip range of motion unable to reproduce pain with hip testing.  Neurological: He is alert and oriented to person, place, and time.  Skin: Skin is warm and dry.  Approximately 1 cm slightly hyperpigmented centrally thickened nevous appearance nevous on the right Malar prominence. No erythema    Psychiatric: He  has a normal mood and affect. His behavior is normal.  Nursing note and vitals reviewed.     Assessment & Plan:   JAVEON MACMURRAY is a 69 y.o. male Annual physical exam  -anticipatory guidance as below in AVS, screening labs above. Health maintenance items as above in HPI discussed/recommended as applicable.   Essential hypertension - Plan: COMPLETE METABOLIC PANEL WITH GFR, Lipid panel, EKG 12-Lead, TSH  -elevated in office, but home readings better by report and off meds this am. No change in meds for now, but if home readings elevated - call for med change.   Left sided sciatica - Plan: predniSONE (DELTASONE) 20 MG tablet  - persistent, without significant relief with naprosyn.   -stop naprosyn, trial of prednisone - SED  -if not improving - consider referral to back/neuro. MRI pending.   Need for shingles vaccine - Plan: zoster vaccine live, PF, (ZOSTAVAX) 16010 UNT/0.65ML injection printed.   Colon cancer screening - Plan: Ambulatory referral to Gastroenterology  Screening, anemia, deficiency, iron - Plan: CBC  Screening for hyperlipidemia - Plan: COMPLETE METABOLIC PANEL WITH GFR, Lipid panel  Nocturia, Family history of prostate cancer, Screening for prostate cancer, Abnormal  prostate exam - Plan: PSA  -We discussed pros and cons of prostate cancer screening, and after this discussion, he chose to have screening done. PSA obtained. Nodule vs firm area on left lobe. Exam abnormality and nocturia (simialr for past few years) - refer to urology for eval.   Nevus of face - Plan: Ambulatory referral to Dermatology.  Discussed DDX of squamous cell as sun exposed area. Derm to eval and probable biopsy.    Meds ordered this encounter  Medications  . zoster vaccine live, PF, (ZOSTAVAX) 93235 UNT/0.65ML injection    Sig: Inject 19,400 Units into the skin once.    Dispense:  1 each    Refill:  0  . aspirin 81 MG tablet    Sig: Take 81 mg by mouth daily.  . predniSONE (DELTASONE) 20 MG tablet    Sig: 3 by mouth for 3 days, then 2 by mouth for 2 days, then 1 by mouth for 2 days, then 1/2 by mouth for 2 days.    Dispense:  16 tablet    Refill:  0   Patient Instructions  Keep a record of your blood pressures outside of the office and if running over 140/90 - call me as you may need additional medicine if remains elevated. Make sure you take your medicines every day.  Continue the low salt diet and activity/exercise most days of the week (150 minutes minimum per week).   Stop the naprosyn. We can try a round of prednisone to see if this will help the sciatica, but if not helping in next 2 weeks - will need MRI and possibly back or neuro specialist.  Return to the clinic or go to the nearest emergency room if any of your symptoms worsen or new symptoms occur.  Call eye care provider to get an appointment for your eyes.   I will refer you to dermatologist for area on face and gastroenterologist for colon cancer screening.   I will also refer you to urologist to recheck the prostate, and discuss the nighttime urination  Return to the clinic or go to the nearest emergency room if any of your symptoms worsen or new symptoms occur.  Keeping you healthy  Get these  tests  Blood pressure- Have your blood pressure checked once a year  by your healthcare provider.  Normal blood pressure is 120/80  Weight- Have your body mass index (BMI) calculated to screen for obesity.  BMI is a measure of body fat based on height and weight. You can also calculate your own BMI at ViewBanking[employer]si.  Cholesterol- Have your cholesterol checked every year.  Diabetes- Have your blood sugar checked regularly if you have high blood pressure, high cholesterol, have a family history of diabetes or if you are overweight.  Screening for Colon Cancer- Colonoscopy starting at age 64.  Screening may begin sooner depending on your family history and other health conditions. Follow up colonoscopy as directed by your Gastroenterologist.  Screening for Prostate Cancer- Both blood work (PSA) and a rectal exam help screen for Prostate Cancer.  Screening begins at age 77 with African-American men and at age 48 with Caucasian men.  Screening may begin sooner depending on your family history.  Take these medicines  Aspirin- One aspirin daily can help prevent Heart disease and Stroke.  Flu shot- Every fall.  Tetanus- Every 10 years.  Zostavax- Once after the age of 61 to prevent Shingles.  Pneumonia shot- Once after the age of 33; if you are younger than 36, ask your healthcare provider if you need a Pneumonia shot.  Take these steps  Don't smoke- If you do smoke, talk to your doctor about quitting.  For tips on how to quit, go to www.smokefree.gov or call 1-800-QUIT-NOW.  Be physically active- Exercise 5 days a week for at least 30 minutes.  If you are not already physically active start slow and gradually work up to 30 minutes of moderate physical activity.  Examples of moderate activity include walking briskly, mowing the yard, dancing, swimming, bicycling, etc.  Eat a healthy diet- Eat a variety of healthy food such as fruits, vegetables, low fat milk, low fat cheese, yogurt,  lean meant, poultry, fish, beans, tofu, etc. For more information go to www.thenutritionsource.org  Drink alcohol in moderation- Limit alcohol intake to less than two drinks a day. Never drink and drive.  Dentist- Brush and floss twice daily; visit your dentist twice a year.  Depression- Your emotional health is as important as your physical health. If you're feeling down, or losing interest in things you would normally enjoy please talk to your healthcare provider.  Eye exam- Visit your eye doctor every year.  Safe sex- If you may be exposed to a sexually transmitted infection, use a condom.  Seat belts- Seat belts can save your life; always wear one.  Smoke/Carbon Monoxide detectors- These detectors need to be installed on the appropriate level of your home.  Replace batteries at least once a year.  Skin cancer- When out in the sun, cover up and use sunscreen 15 SPF or higher.  Violence- If anyone is threatening you, please tell your healthcare provider.  Living Will/ Health care power of attorney- Speak with your healthcare provider and family - as we discussed - work on this and let me know of your wishes as well.       I personally performed the services described in this documentation, which was scribed in my presence. The recorded information has been reviewed and considered, and addended by me as needed.

## 2014-08-20 NOTE — Progress Notes (Signed)
   Subjective:    Patient ID: John Stewart, male    DOB: 23-May-1945, 69 y.o.   MRN: [id number]  HPI    Review of Systems  Constitutional: Negative.   HENT: Negative.   Eyes: Negative.   Respiratory: Negative.   Cardiovascular: Negative.   Gastrointestinal: Negative.   Endocrine: Negative.   Genitourinary: Negative.   Musculoskeletal: Positive for arthralgias.  Skin: Negative.   Allergic/Immunologic: Negative.   Neurological: Negative.   Hematological: Negative.   Psychiatric/Behavioral: Negative.        Objective:   Physical Exam        Assessment & Plan:

## 2014-08-21 LAB — PSA: PSA: 0.5 ng/mL (ref <=4.00)

## 2014-08-21 LAB — TSH: TSH: 1.809 u[IU]/mL (ref 0.350–4.500)

## 2014-08-23 ENCOUNTER — Encounter: Payer: Self-pay | Admitting: Emergency Medicine

## 2014-08-27 ENCOUNTER — Encounter: Payer: Self-pay | Admitting: *Deleted

## 2014-09-10 ENCOUNTER — Encounter: Payer: Self-pay | Admitting: Family Medicine

## 2014-09-24 ENCOUNTER — Encounter: Payer: Self-pay | Admitting: Family Medicine

## 2014-09-25 ENCOUNTER — Other Ambulatory Visit: Payer: Self-pay | Admitting: Emergency Medicine

## 2014-09-25 ENCOUNTER — Ambulatory Visit
Admission: RE | Admit: 2014-09-25 | Discharge: 2014-09-25 | Disposition: A | Discharge status: Home / Self Care | Payer: Self-pay | Source: Ambulatory Visit | Attending: Emergency Medicine | Admitting: Emergency Medicine

## 2014-09-25 DIAGNOSIS — M5432 Sciatica, left side: Secondary | ICD-10-CM

## 2014-09-25 DIAGNOSIS — M4806 Spinal stenosis, lumbar region: Secondary | ICD-10-CM | POA: Diagnosis not present

## 2014-09-25 DIAGNOSIS — M47817 Spondylosis without myelopathy or radiculopathy, lumbosacral region: Secondary | ICD-10-CM | POA: Diagnosis not present

## 2014-10-03 ENCOUNTER — Ambulatory Visit (INDEPENDENT_AMBULATORY_CARE_PROVIDER_SITE_OTHER): Payer: Commercial Managed Care - HMO | Admitting: Family Medicine

## 2014-10-03 VITALS — BP 140/90 | HR 88 | Temp 98.6°F | Resp 16 | Ht 65.0 in | Wt 213.8 lb

## 2014-10-03 DIAGNOSIS — M4806 Spinal stenosis, lumbar region: Secondary | ICD-10-CM

## 2014-10-03 DIAGNOSIS — M5442 Lumbago with sciatica, left side: Secondary | ICD-10-CM

## 2014-10-03 DIAGNOSIS — M5432 Sciatica, left side: Secondary | ICD-10-CM | POA: Diagnosis not present

## 2014-10-03 DIAGNOSIS — M48061 Spinal stenosis, lumbar region without neurogenic claudication: Secondary | ICD-10-CM

## 2014-10-03 MED ORDER — TRAMADOL HCL 50 MG PO TABS: 50.0000 mg | ORAL_TABLET | Freq: Three times a day (TID) | ORAL | Status: DC | PRN

## 2014-10-03 MED ORDER — TRAZODONE HCL 50 MG PO TABS: 25.0000 mg | ORAL_TABLET | Freq: Every evening | ORAL | Status: DC | PRN

## 2014-10-03 NOTE — Progress Notes (Signed)
  Subjective:  Patient ID: John Stewart, male    DOB: Aug 30, 1945  Age: 69 y.o. MRN: [id number]  Patient has been having more problems with pain from his low back down through his buttock and down his left leg. He hurts especially bad when he lays at night. He tries to lie on his right side keeping a pillow between his legs to get comfortable. When he stands for a while he gets hurting more also. He is scheduled to see a neurosurgeon, but that will be a month from now. The MRI has been done and he has a copy of it we reviewed it again and discuss it further.   Objective:   Spinal stenosis and disc disease on the MRI, with a lot of cystic changes  Tender in the lower lumbar spine. Straight leg raising positive at about 35 on the left. Seated straight leg was negative right leg is nonpainful.  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

## 2014-10-03 NOTE — Patient Instructions (Signed)
Take tramadol 1 at bedtime (can use every 8 hours if needed) I would recommend taking a couple of Tylenol with it for a stronger effect  In addition to these you can take Aleve 2 pills twice daily if absolutely necessary  Keep your appointment with the neurosurgeon  Return as needed

## 2014-10-10 ENCOUNTER — Encounter: Payer: Self-pay | Admitting: Family Medicine

## 2014-10-10 DIAGNOSIS — L821 Other seborrheic keratosis: Secondary | ICD-10-CM | POA: Diagnosis not present

## 2014-10-26 DIAGNOSIS — R3912 Poor urinary stream: Secondary | ICD-10-CM | POA: Diagnosis not present

## 2014-10-26 DIAGNOSIS — R350 Frequency of micturition: Secondary | ICD-10-CM | POA: Diagnosis not present

## 2014-10-26 DIAGNOSIS — N401 Enlarged prostate with lower urinary tract symptoms: Secondary | ICD-10-CM | POA: Diagnosis not present

## 2014-10-29 ENCOUNTER — Ambulatory Visit (INDEPENDENT_AMBULATORY_CARE_PROVIDER_SITE_OTHER): Payer: Commercial Managed Care - HMO | Admitting: Emergency Medicine

## 2014-10-29 VITALS — BP 126/70 | HR 63 | Temp 98.0°F | Resp 16 | Ht 65.0 in | Wt 207.0 lb

## 2014-10-29 DIAGNOSIS — K219 Gastro-esophageal reflux disease without esophagitis: Secondary | ICD-10-CM | POA: Diagnosis not present

## 2014-10-29 DIAGNOSIS — I10 Essential (primary) hypertension: Secondary | ICD-10-CM | POA: Diagnosis not present

## 2014-10-29 MED ORDER — HYDROCHLOROTHIAZIDE 25 MG PO TABS: 25.0000 mg | ORAL_TABLET | Freq: Every day | ORAL | Status: DC

## 2014-10-29 MED ORDER — LISINOPRIL 40 MG PO TABS: 40.0000 mg | ORAL_TABLET | Freq: Every day | ORAL | Status: DC

## 2014-10-29 MED ORDER — OMEPRAZOLE 20 MG PO CPDR: 20.0000 mg | DELAYED_RELEASE_CAPSULE | Freq: Every day | ORAL | Status: DC

## 2014-10-29 NOTE — Progress Notes (Signed)
Subjective:  Patient ID: John Stewart, male    DOB: Feb 10, 1946  Age: 69 y.o. MRN: [id number]  CC: Medication Refill   HPI John Stewart presents  filled his medications. He has a history of hypertension is well-controlled no adverse effect of medication. Has a history of gastroesophageal reflux disorder which is under control with medication. He denies any new or acute medical problems.  History John Stewart has a past medical history of Hypertension and Acid reflux.   He has past surgical history that includes biceps tendon surgery (Right) and Shoulder surgery.   His  family history includes Cancer in his father.  He   reports that he has never smoked. He does not have any smokeless tobacco history on file. He reports that he drinks about 1.2 oz of alcohol per week. He reports that he does not use illicit drugs.  Outpatient Prescriptions Prior to Visit  Medication Sig Dispense Refill  . aspirin 81 MG tablet Take 81 mg by mouth daily.    . traMADol (ULTRAM) 50 MG tablet Take 1 tablet (50 mg total) by mouth every 8 (eight) hours as needed. 50 tablet 1  . hydrochlorothiazide (HYDRODIURIL) 25 MG tablet Take 1 tablet (25 mg total) by mouth daily. 90 tablet 3  . lisinopril (PRINIVIL,ZESTRIL) 40 MG tablet Take 1 tablet (40 mg total) by mouth daily. 90 tablet 1  . omeprazole (PRILOSEC) 20 MG capsule Take 1 capsule (20 mg total) by mouth daily. 90 capsule 1  . naproxen sodium (ANAPROX DS) 550 MG tablet Take 1 tablet (550 mg total) by mouth 2 (two) times daily with a meal. (Patient not taking: Reported on 10/29/2014) 40 tablet 0  . predniSONE (DELTASONE) 20 MG tablet 3 by mouth for 3 days, then 2 by mouth for 2 days, then 1 by mouth for 2 days, then 1/2 by mouth for 2 days. (Patient not taking: Reported on 10/03/2014) 16 tablet 0  . zoster vaccine live, PF, (ZOSTAVAX) 71062 UNT/0.65ML injection Inject 19,400 Units into the skin once. (Patient not taking: Reported on 10/03/2014) 1 each 0   No  facility-administered medications prior to visit.    History   Social History  . Marital Status: Married    Spouse Name: N/A  . Number of Children: N/A  . Years of Education: N/A   Social History Main Topics  . Smoking status: Never Smoker   . Smokeless tobacco: Not on file  . Alcohol Use: 1.2 oz/week    0 Standard drinks or equivalent, 2 Cans of beer per week  . Drug Use: No  . Sexual Activity: Not on file   Other Topics Concern  . None   Social History Narrative     Review of Systems  Constitutional: Negative for fever, chills and appetite change.  HENT: Negative for congestion, ear pain, postnasal drip, sinus pressure and sore throat.   Eyes: Negative for pain and redness.  Respiratory: Negative for cough, shortness of breath and wheezing.   Cardiovascular: Negative for leg swelling.  Gastrointestinal: Negative for nausea, vomiting, abdominal pain, diarrhea, constipation and blood in stool.  Endocrine: Negative for polyuria.  Genitourinary: Negative for dysuria, urgency, frequency and flank pain.  Musculoskeletal: Negative for gait problem.  Skin: Negative for rash.  Neurological: Negative for weakness and headaches.  Psychiatric/Behavioral: Negative for confusion and decreased concentration. The patient is not nervous/anxious.     Objective:  BP 126/70 mmHg  Pulse 63  Temp(Src) 98 F (36.7 C) (Oral)  Resp 16  Ht 5\' 5"  (1.651 m)  Wt 207 lb (93.895 kg)  BMI 34.45 kg/m2  SpO2 98%  Physical Exam  Constitutional: He is oriented to person, place, and time. He appears well-developed and well-nourished. No distress.  HENT:  Head: Normocephalic and atraumatic.  Right Ear: External ear normal.  Left Ear: External ear normal.  Nose: Nose normal.  Eyes: Conjunctivae and EOM are normal. Pupils are equal, round, and reactive to light. No scleral icterus.  Neck: Normal range of motion. Neck supple. No tracheal deviation present.  Cardiovascular: Normal rate, regular  rhythm and normal heart sounds.   Pulmonary/Chest: Effort normal. No respiratory distress. He has no wheezes. He has no rales.  Abdominal: He exhibits no mass. There is no tenderness. There is no rebound and no guarding.  Musculoskeletal: He exhibits no edema.  Lymphadenopathy:    He has no cervical adenopathy.  Neurological: He is alert and oriented to person, place, and time. Coordination normal.  Skin: Skin is warm and dry. No rash noted.  Psychiatric: He has a normal mood and affect. His behavior is normal.      Assessment & Plan:   John Stewart was seen today for medication refill.  Diagnoses and all orders for this visit:  Essential hypertension Orders: -     lisinopril (PRINIVIL,ZESTRIL) 40 MG tablet; Take 1 tablet (40 mg total) by mouth daily.  Gastroesophageal reflux disease, esophagitis presence not specified  Other orders -     hydrochlorothiazide (HYDRODIURIL) 25 MG tablet; Take 1 tablet (25 mg total) by mouth daily. -     omeprazole (PRILOSEC) 20 MG capsule; Take 1 capsule (20 mg total) by mouth daily.   I am having John Stewart maintain his naproxen sodium, zoster vaccine live (PF), aspirin, predniSONE, traMADol, hydrochlorothiazide, lisinopril, and omeprazole.  Meds ordered this encounter  Medications  . hydrochlorothiazide (HYDRODIURIL) 25 MG tablet    Sig: Take 1 tablet (25 mg total) by mouth daily.    Dispense:  90 tablet    Refill:  3  . lisinopril (PRINIVIL,ZESTRIL) 40 MG tablet    Sig: Take 1 tablet (40 mg total) by mouth daily.    Dispense:  90 tablet    Refill:  1  . omeprazole (PRILOSEC) 20 MG capsule    Sig: Take 1 capsule (20 mg total) by mouth daily.    Dispense:  90 capsule    Refill:  1    Appropriate red flag conditions were discussed with the patient as well as actions that should be taken.  Patient expressed his understanding.  Follow-up: Return in about 6 months (around 05/01/2015).  John Culver, MD

## 2014-10-29 NOTE — Patient Instructions (Signed)

## 2014-11-01 DIAGNOSIS — M549 Dorsalgia, unspecified: Secondary | ICD-10-CM | POA: Diagnosis not present

## 2014-11-01 DIAGNOSIS — M5417 Radiculopathy, lumbosacral region: Secondary | ICD-10-CM | POA: Diagnosis not present

## 2014-11-01 DIAGNOSIS — I10 Essential (primary) hypertension: Secondary | ICD-10-CM | POA: Diagnosis not present

## 2014-11-16 DIAGNOSIS — M5416 Radiculopathy, lumbar region: Secondary | ICD-10-CM | POA: Diagnosis not present

## 2015-02-15 ENCOUNTER — Ambulatory Visit (INDEPENDENT_AMBULATORY_CARE_PROVIDER_SITE_OTHER): Payer: Commercial Managed Care - HMO | Admitting: Emergency Medicine

## 2015-02-15 VITALS — BP 154/92 | HR 79 | Temp 98.6°F | Resp 17 | Ht 64.5 in | Wt 211.0 lb

## 2015-02-15 DIAGNOSIS — M5442 Lumbago with sciatica, left side: Secondary | ICD-10-CM

## 2015-02-15 DIAGNOSIS — M5432 Sciatica, left side: Secondary | ICD-10-CM | POA: Diagnosis not present

## 2015-02-15 DIAGNOSIS — M4806 Spinal stenosis, lumbar region: Secondary | ICD-10-CM | POA: Diagnosis not present

## 2015-02-15 DIAGNOSIS — Z23 Encounter for immunization: Secondary | ICD-10-CM

## 2015-02-15 DIAGNOSIS — I10 Essential (primary) hypertension: Secondary | ICD-10-CM | POA: Diagnosis not present

## 2015-02-15 DIAGNOSIS — M48061 Spinal stenosis, lumbar region without neurogenic claudication: Secondary | ICD-10-CM

## 2015-02-15 MED ORDER — TRAMADOL HCL 50 MG PO TABS: 50.0000 mg | ORAL_TABLET | Freq: Three times a day (TID) | ORAL | Status: DC | PRN

## 2015-02-15 MED ORDER — LISINOPRIL 40 MG PO TABS: 40.0000 mg | ORAL_TABLET | Freq: Every day | ORAL | Status: DC

## 2015-02-15 MED ORDER — NAPROXEN SODIUM 550 MG PO TABS: 550.0000 mg | ORAL_TABLET | Freq: Two times a day (BID) | ORAL | Status: DC

## 2015-02-15 MED ORDER — HYDROCHLOROTHIAZIDE 25 MG PO TABS: 25.0000 mg | ORAL_TABLET | Freq: Every day | ORAL | Status: DC

## 2015-02-15 NOTE — Patient Instructions (Signed)

## 2015-02-15 NOTE — Progress Notes (Signed)
Subjective:  Patient ID: John Stewart, male    DOB: 1945/05/30  Age: 70 y.o. MRN: [id number]  CC: medication request for pain and Immunizations   HPI John Stewart presents  patient has a history of sciatic neuritis. He's had one epidural steroid injection and is thinking about having surgery because of the injection fail. He is out of medication for pain. He's come in requesting a refill. He has pain in the left sciatic notch to below the knee. Denies any numbness tingling or weakness or foot drop.  History John Stewart has a past medical history of Hypertension and Acid reflux.   He has past surgical history that includes biceps tendon surgery (Right) and Shoulder surgery.   His  family history includes Cancer in his father.  He   reports that he has never smoked. He does not have any smokeless tobacco history on file. He reports that he drinks about 1.2 oz of alcohol per week. He reports that he does not use illicit drugs.  Outpatient Prescriptions Prior to Visit  Medication Sig Dispense Refill  . aspirin 81 MG tablet Take 81 mg by mouth daily.    John Stewart Kitchen omeprazole (PRILOSEC) 20 MG capsule Take 1 capsule (20 mg total) by mouth daily. 90 capsule 1  . hydrochlorothiazide (HYDRODIURIL) 25 MG tablet Take 1 tablet (25 mg total) by mouth daily. 90 tablet 3  . lisinopril (PRINIVIL,ZESTRIL) 40 MG tablet Take 1 tablet (40 mg total) by mouth daily. 90 tablet 1  . traMADol (ULTRAM) 50 MG tablet Take 1 tablet (50 mg total) by mouth every 8 (eight) hours as needed. 50 tablet 1  . zoster vaccine live, PF, (ZOSTAVAX) 38182 UNT/0.65ML injection Inject 19,400 Units into the skin once. (Patient not taking: Reported on 10/03/2014) 1 each 0  . naproxen sodium (ANAPROX DS) 550 MG tablet Take 1 tablet (550 mg total) by mouth 2 (two) times daily with a meal. (Patient not taking: Reported on 10/29/2014) 40 tablet 0  . predniSONE (DELTASONE) 20 MG tablet 3 by mouth for 3 days, then 2 by mouth for 2 days, then 1  by mouth for 2 days, then 1/2 by mouth for 2 days. (Patient not taking: Reported on 10/03/2014) 16 tablet 0   No facility-administered medications prior to visit.    Social History   Social History  . Marital Status: Married    Spouse Name: N/A  . Number of Children: N/A  . Years of Education: N/A   Social History Main Topics  . Smoking status: Never Smoker   . Smokeless tobacco: None  . Alcohol Use: 1.2 oz/week    0 Standard drinks or equivalent, 2 Cans of beer per week  . Drug Use: No  . Sexual Activity: Not Asked   Other Topics Concern  . None   Social History Narrative     Review of Systems  Constitutional: Negative for fever, chills and appetite change.  HENT: Negative for congestion, ear pain, postnasal drip, sinus pressure and sore throat.   Eyes: Negative for pain and redness.  Respiratory: Negative for cough, shortness of breath and wheezing.   Cardiovascular: Negative for leg swelling.  Gastrointestinal: Negative for nausea, vomiting, abdominal pain, diarrhea, constipation and blood in stool.  Endocrine: Negative for polyuria.  Genitourinary: Negative for dysuria, urgency, frequency and flank pain.  Musculoskeletal: Negative for gait problem.  Skin: Negative for rash.  Neurological: Negative for weakness and headaches.  Psychiatric/Behavioral: Negative for confusion and decreased concentration. The patient is not  nervous/anxious.     Objective:  BP 154/92 mmHg  Pulse 79  Temp(Src) 98.6 F (37 C) (Oral)  Resp 17  Ht 5' 4.5" (1.638 m)  Wt 211 lb (95.709 kg)  BMI 35.67 kg/m2  SpO2 97%  Physical Exam  Constitutional: He is oriented to person, place, and time. He appears well-developed and well-nourished.  HENT:  Head: Normocephalic and atraumatic.  Eyes: Conjunctivae are normal. Pupils are equal, round, and reactive to light.  Pulmonary/Chest: Effort normal.  Musculoskeletal: He exhibits no edema.  Neurological: He is alert and oriented to person,  place, and time.  Skin: Skin is dry.  Psychiatric: He has a normal mood and affect. His behavior is normal. Thought content normal.      Assessment & Plan:   Sagan was seen today for medication request for pain and immunizations.  Diagnoses and all orders for this visit:  Sciatica associated with disorder of lumbar spine, left -     traMADol (ULTRAM) 50 MG tablet; Take 1 tablet (50 mg total) by mouth every 8 (eight) hours as needed.  Need for prophylactic vaccination and inoculation against influenza -     Flu Vaccine QUAD 36+ mos IM  Spinal stenosis of lumbar region -     traMADol (ULTRAM) 50 MG tablet; Take 1 tablet (50 mg total) by mouth every 8 (eight) hours as needed.  Left-sided low back pain with left-sided sciatica -     traMADol (ULTRAM) 50 MG tablet; Take 1 tablet (50 mg total) by mouth every 8 (eight) hours as needed.  Essential hypertension -     lisinopril (PRINIVIL,ZESTRIL) 40 MG tablet; Take 1 tablet (40 mg total) by mouth daily.  Other orders -     naproxen sodium (ANAPROX DS) 550 MG tablet; Take 1 tablet (550 mg total) by mouth 2 (two) times daily with a meal. -     hydrochlorothiazide (HYDRODIURIL) 25 MG tablet; Take 1 tablet (25 mg total) by mouth daily.   I have discontinued Mr. Kurtz predniSONE. I am also having him maintain his zoster vaccine live (PF), aspirin, omeprazole, naproxen sodium, traMADol, lisinopril, and hydrochlorothiazide.  Meds ordered this encounter  Medications  . naproxen sodium (ANAPROX DS) 550 MG tablet    Sig: Take 1 tablet (550 mg total) by mouth 2 (two) times daily with a meal.    Dispense:  40 tablet    Refill:  0  . traMADol (ULTRAM) 50 MG tablet    Sig: Take 1 tablet (50 mg total) by mouth every 8 (eight) hours as needed.    Dispense:  50 tablet    Refill:  1  . lisinopril (PRINIVIL,ZESTRIL) 40 MG tablet    Sig: Take 1 tablet (40 mg total) by mouth daily.    Dispense:  90 tablet    Refill:  3  . hydrochlorothiazide  (HYDRODIURIL) 25 MG tablet    Sig: Take 1 tablet (25 mg total) by mouth daily.    Dispense:  90 tablet    Refill:  3   He was encouraged to continue to use the epidural steroid in a series of 3 and if that doesn't work-consider surgery because considering analysis premature  Appropriate red flag conditions were discussed with the patient as well as actions that should be taken.  Patient expressed his understanding.  Follow-up: Return if symptoms worsen or fail to improve.  Roselee Culver, MD

## 2015-03-14 ENCOUNTER — Telehealth: Payer: Self-pay | Admitting: Family Medicine

## 2015-03-14 NOTE — Telephone Encounter (Signed)
SPOKE WITH PATIENT AND HE HAD A COLO GUARD DONE THROUGH [employer].  HE WILL BRING [place] A COPY OF THE RESULTS BY TO PUT IN HIS CHART.

## 2015-03-21 ENCOUNTER — Emergency Department (HOSPITAL_COMMUNITY): Payer: Commercial Managed Care - HMO

## 2015-03-21 ENCOUNTER — Emergency Department (HOSPITAL_COMMUNITY)
Admission: EM | Admit: 2015-03-21 | Discharge: 2015-03-21 | Disposition: A | Discharge status: Home / Self Care | Payer: Commercial Managed Care - HMO | Attending: Emergency Medicine | Admitting: Emergency Medicine

## 2015-03-21 ENCOUNTER — Encounter (HOSPITAL_COMMUNITY): Payer: Self-pay | Admitting: Emergency Medicine

## 2015-03-21 DIAGNOSIS — Z7982 Long term (current) use of aspirin: Secondary | ICD-10-CM | POA: Insufficient documentation

## 2015-03-21 DIAGNOSIS — R262 Difficulty in walking, not elsewhere classified: Secondary | ICD-10-CM | POA: Insufficient documentation

## 2015-03-21 DIAGNOSIS — R260 Ataxic gait: Secondary | ICD-10-CM | POA: Diagnosis not present

## 2015-03-21 DIAGNOSIS — R42 Dizziness and giddiness: Secondary | ICD-10-CM | POA: Insufficient documentation

## 2015-03-21 DIAGNOSIS — Z79899 Other long term (current) drug therapy: Secondary | ICD-10-CM | POA: Insufficient documentation

## 2015-03-21 DIAGNOSIS — K219 Gastro-esophageal reflux disease without esophagitis: Secondary | ICD-10-CM | POA: Diagnosis not present

## 2015-03-21 DIAGNOSIS — I10 Essential (primary) hypertension: Secondary | ICD-10-CM | POA: Insufficient documentation

## 2015-03-21 DIAGNOSIS — Z791 Long term (current) use of non-steroidal anti-inflammatories (NSAID): Secondary | ICD-10-CM | POA: Diagnosis not present

## 2015-03-21 LAB — I-STAT TROPONIN, ED: Troponin i, poc: 0 ng/mL (ref 0.00–0.08)

## 2015-03-21 LAB — PROTIME-INR
INR: 1.07 (ref 0.00–1.49)
PROTHROMBIN TIME: 14.1 s (ref 11.6–15.2)

## 2015-03-21 LAB — COMPREHENSIVE METABOLIC PANEL
ALBUMIN: 3.9 g/dL (ref 3.5–5.0)
ALT: 45 U/L (ref 17–63)
ANION GAP: 9 (ref 5–15)
AST: 36 U/L (ref 15–41)
Alkaline Phosphatase: 55 U/L (ref 38–126)
BUN: 13 mg/dL (ref 6–20)
CO2: 26 mmol/L (ref 22–32)
Calcium: 9.3 mg/dL (ref 8.9–10.3)
Chloride: 102 mmol/L (ref 101–111)
Creatinine, Ser: 0.91 mg/dL (ref 0.61–1.24)
GFR calc Af Amer: >60 mL/min (ref >60)
GFR calc non Af Amer: >60 mL/min (ref >60)
GLUCOSE: 136 mg/dL — AB (ref 65–99)
POTASSIUM: 3.8 mmol/L (ref 3.5–5.1)
SODIUM: 137 mmol/L (ref 135–145)
Total Bilirubin: 0.8 mg/dL (ref 0.3–1.2)
Total Protein: 6.7 g/dL (ref 6.5–8.1)

## 2015-03-21 LAB — APTT: aPTT: 27 seconds (ref 24–37)

## 2015-03-21 LAB — I-STAT CHEM 8, ED
BUN: 15 mg/dL (ref 6–20)
CHLORIDE: 100 mmol/L — AB (ref 101–111)
Calcium, Ion: 1.18 mmol/L (ref 1.13–1.30)
Creatinine, Ser: 0.8 mg/dL (ref 0.61–1.24)
Glucose, Bld: 131 mg/dL — ABNORMAL HIGH (ref 65–99)
HEMATOCRIT: 48 % (ref 39.0–52.0)
Hemoglobin: 16.3 g/dL (ref 13.0–17.0)
Potassium: 3.7 mmol/L (ref 3.5–5.1)
SODIUM: 138 mmol/L (ref 135–145)
TCO2: 26 mmol/L (ref 0–100)

## 2015-03-21 LAB — DIFFERENTIAL
BASOS PCT: 0 %
Basophils Absolute: 0 10*3/uL (ref 0.0–0.1)
EOS ABS: 0.1 10*3/uL (ref 0.0–0.7)
EOS PCT: 2 %
Lymphocytes Relative: 18 %
Lymphs Abs: 1 10*3/uL (ref 0.7–4.0)
Monocytes Absolute: 0.3 10*3/uL (ref 0.1–1.0)
Monocytes Relative: 6 %
NEUTROS PCT: 74 %
Neutro Abs: 4.2 10*3/uL (ref 1.7–7.7)

## 2015-03-21 LAB — CBC
HCT: 45.1 % (ref 39.0–52.0)
Hemoglobin: 15.2 g/dL (ref 13.0–17.0)
MCH: 32.1 pg (ref 26.0–34.0)
MCHC: 33.7 g/dL (ref 30.0–36.0)
MCV: 95.1 fL (ref 78.0–100.0)
PLATELETS: 276 10*3/uL (ref 150–400)
RBC: 4.74 MIL/uL (ref 4.22–5.81)
RDW: 12.2 % (ref 11.5–15.5)
WBC: 5.6 10*3/uL (ref 4.0–10.5)

## 2015-03-21 MED ORDER — MECLIZINE HCL 25 MG PO TABS: 25.0000 mg | ORAL_TABLET | Freq: Three times a day (TID) | ORAL | Status: DC | PRN

## 2015-03-21 MED ORDER — MECLIZINE HCL 25 MG PO TABS
25.0000 mg | ORAL_TABLET | Freq: Once | ORAL | Status: AC
  Administered 2015-03-21: 25 mg via ORAL
  Filled 2015-03-21: qty 1

## 2015-03-21 NOTE — Discharge Instructions (Signed)
Dizziness Dizziness is a common problem. It is a feeling of unsteadiness or light-headedness. You may feel like you are about to faint. Dizziness can lead to injury if you stumble or fall. Anyone can become dizzy, but dizziness is more common in older adults. This condition can be caused by a number of things, including medicines, dehydration, or illness. HOME CARE INSTRUCTIONS Taking these steps may help with your condition: Eating and Drinking  Drink enough fluid to keep your urine clear or pale yellow. This helps to keep you from becoming dehydrated. Try to drink more clear fluids, such as water.  Do not drink alcohol.  Limit your caffeine intake if directed by your health care provider.  Limit your salt intake if directed by your health care provider. Activity  Avoid making quick movements.  Rise slowly from chairs and steady yourself until you feel okay.  In the morning, first sit up on the side of the bed. When you feel okay, stand slowly while you hold onto something until you know that your balance is fine.  Move your legs often if you need to stand in one place for a long time. Tighten and relax your muscles in your legs while you are standing.  Do not drive or operate heavy machinery if you feel dizzy.  Avoid bending down if you feel dizzy. Place items in your home so that they are easy for you to reach without leaning over. Lifestyle  Do not use any tobacco products, including cigarettes, chewing tobacco, or electronic cigarettes. If you need help quitting, ask your health care provider.  Try to reduce your stress level, such as with yoga or meditation. Talk with your health care provider if you need help. General Instructions  Watch your dizziness for any changes.  Take medicines only as directed by your health care provider. Talk with your health care provider if you think that your dizziness is caused by a medicine that you are taking.  Tell a friend or a family  member that you are feeling dizzy. If he or she notices any changes in your behavior, have this person call your health care provider.  Keep all follow-up visits as directed by your health care provider. This is important. SEEK MEDICAL CARE IF:  Your dizziness does not go away.  Your dizziness or light-headedness gets worse.  You feel nauseous.  You have reduced hearing.  You have new symptoms.  You are unsteady on your feet or you feel like the room is spinning. SEEK IMMEDIATE MEDICAL CARE IF:  You vomit or have diarrhea and are unable to eat or drink anything.  You have problems talking, walking, swallowing, or using your arms, hands, or legs.  You feel generally weak.  You are not thinking clearly or you have trouble forming sentences. It may take a friend or family member to notice this.  You have chest pain, abdominal pain, shortness of breath, or sweating.  Your vision changes.  You notice any bleeding.  You have a headache.  You have neck pain or a stiff neck.  You have a fever.   This information is not intended to replace advice given to you by your health care provider. Make sure you discuss any questions you have with your health care provider.   Document Released: 09/23/2000 Document Revised: 08/14/2014 Document Reviewed: 03/26/2014 Elsevier Interactive Patient Education 2016 [employer].   Emergency Department Resource Guide 1) Find a Doctor and Pay Out of Pocket Although you won't have  to find out who is covered by your insurance plan, it is a good idea to ask around and get recommendations. You will then need to call the office and see if the doctor you have chosen will accept you as a new patient and what types of options they offer for patients who are self-pay. Some doctors offer discounts or will set up payment plans for their patients who do not have insurance, but you will need to ask so you aren't surprised when you get to your appointment.  2)  Contact Your Local Health Department Not all health departments have doctors that can see patients for sick visits, but many do, so it is worth a call to see if yours does. If you don't know where your local health department is, you can check in your phone book. The CDC also has a tool to help you locate your state's health department, and many state websites also have listings of all of their local health departments.  3) Find a [facility] [place] [facility] If your illness is not likely to be very severe or complicated, you may want to try a walk in clinic. These are popping up all over the country in pharmacies, drugstores, and shopping centers. They're usually staffed by nurse practitioners or physician assistants that have been trained to treat common illnesses and complaints. They're usually fairly quick and inexpensive. However, if you have serious medical issues or chronic medical problems, these are probably not your best option.  No Primary Care Doctor: - Call Health Connect at  [phone] - they can help you locate a primary care doctor that  accepts your insurance, provides certain services, etc. - Physician Referral Service- [phone]  Chronic Pain Problems: Organization         Address  Phone   Notes  [facility]  [phone] Patients need to be referred by their primary care doctor.   Medication Assistance: Organization         Address  Phone   Notes  [facility] Medication [facility] [address] [place], [place] [address] [phone] --Must be a resident of [facility] -- Must have NO insurance coverage whatsoever (no Medicaid/ Medicare, etc.) -- The pt. MUST have a primary care doctor that directs their care regularly and follows them in the community   MedAssist  [phone]   [employer]  [phone]    Agencies that provide inexpensive medical care: Organization         Address  Phone   Notes  [place]   [phone]   [facility]    [phone]   [facility] [place], [place] [address] [phone]   [place] [address], [place] [phone]   Planned Parenthood    [phone]   [facility]    [phone]   [place] and [place], [place] Phone:  [phone], Fax:  [phone] Hours of Operation:  9 am - 6 pm, M-F.  Also accepts Medicaid/Medicare and self-pay.  [facility] for [place], [address], [place] Phone: [phone], Fax: [phone]. Hours of Operation:  8:30 am - 5:30 pm, M-F.  Also accepts Medicaid and self-pay.  [facility] [address], [employer] Phone: [phone]   [place],  [place], [place] [phone], Ext. [phone] Mondays & Thursdays: 7-9 AM.  First 15 patients are seen on a first come, first serve basis.    [place] Providers:  Organization         Address  Phone   Notes  [facility] [address], [place] [phone] Also accepts self-pay patients.  [facility] [address] [place] [address], [place]  [phone]   [place], [address], [place] [phone]   [facility] [address] [place] [phone]   Lucianne Lei [address], [place]   [phone] Only accepts [place] Access [place] patients after they have their name applied to their card.   Self-Pay (no insurance) in [facility]:  Organization         Address  Phone   Notes  Sickle Cell Patients, [facility] [place] [phone]   [facility] Urgent Care [place] [phone]   [facility] [place]  [place], [place], [place] [phone]   [facility]/Dr. Osei-Bonsu  [address] [place] or [address], [place] [phone] Phone number for both [place] and [place] locations is the same.  [facility] and [facility] [address], [place] [phone]   [facility] [address], [place] or [address] [phone]   [facility] [address] [place] [phone], phone; [phone], fax Sees patients 1st and 3rd Saturday of every month.  Must not qualify for public or private insurance (i.e. Medicaid, Medicare, [place] Health Choice, Veterans' Benefits)  Household income should be no more than 200% of the poverty level The clinic cannot treat you if you are pregnant or think you are pregnant  Sexually transmitted diseases are not treated at the clinic.    Dental Care: Organization         Address  Phone  Notes  [facility] Department of [facility] [place] [phone] Accepts children up to age 44 who are enrolled in [place] or [place]; pregnant women with a Medicaid card; and children who have applied for Medicaid or [place] Health Choice, but were declined, whose parents can pay a reduced fee at time of service.  [facility] Department of [facility]  [address], [place] [phone] Accepts children up to age 62 who are enrolled in [place] or [place] Gu[place]; pregnant women with a Medicaid card; and children who have applied for Medicaid or [place] Health Choice, but were declined, whose parents can pay a reduced fee at time of service.  [place] Adult [facility] Access PROGRAM  [place] [phone] Patients are seen by appointment only. Walk-ins are not accepted. [place] will see patients 2 years of age and older. Monday - Tuesday  (8am-5pm) Most Wednesdays (8:30-5pm) $30 per visit, cash only  [facility] Adult [facility] Access PROGRAM  [address], [facility] [phone] Patients are seen by appointment only. Walk-ins are not accepted. [place] will see patients 51 years of age and older. One Wednesday Evening (Monthly: Volunteer Based).  $30 per visit, cash only  [employer] of Dentistry  Clinics  [phone] for adults; Children under age 47, call Graduate Pediatric Dentistry at [phone]. Children aged 55-14, please call [phone] to request a pediatric application.  Dental services are provided in all areas of dental care including fillings, crowns and bridges, complete and partial dentures, implants, gum treatment, root canals, and extractions. Preventive care is also provided. Treatment is provided to both adults and children. Patients are selected via a lottery and there is often a waiting list.   [facility] [address], [place]  [phone] [email or web address]   [facility] [address] [place], [place] [phone], Ext. [phone] Second and Fourth Thursday of each month, opens at 6:30 AM; Clinic ends at 9 AM.  Patients are seen on a first-come first-served basis, and a limited number are seen during each clinic.   [facility]  [address] [place], [place] [phone]   Eligibility Requirements You must have lived in [place], [place], or [place] counties for at least the last three months.   You cannot be eligible for state or federal sponsored [employer], including [employer], [place], or [employer].   You generally cannot be eligible for healthcare insurance through your employer.    How to apply: Eligibility screenings are held every Tuesday and Wednesday afternoon from 1:00 pm until 4:00 pm. You do not need an appointment for the interview!  [facility] [address] [place], [place]   [place]  [place] Department  [place]  [phone]    Behavioral Health Resources in the Community: Intensive Outpatient Programs Organization         Address  Phone  Notes  [place]. [address] [place], [place] [phone]   [facility] [address], [place], [place]   ADS: Alcohol & Drug Svcs [address] [place], [place]   [place] [address]  [place], [place] or [phone]   Substance Abuse Resources Organization         Address  Phone  Notes  Alcohol and Drug Services  [phone]   [place]  [phone]   [facility] [place]   Chinita Pester  [phone]   Residential & Outpatient Substance Abuse Program  [phone]   Psychological Services Organization         Address  Phone  Notes  [facility] [place]  [place]  [phone]   [place] [address] [place] or [phone]    Mobile Crisis Teams Organization         Address  Phone  Notes  [facility], Mobile Crisis Care Unit  [phone]   [facility]otherapeutic [facility]  [address] [place], [place]   [place] [address], [place] [phone]    Self-Help/Support Groups Organization         Address  Phone             Notes  [place]. of [place] - variety of support groups  [place] Call for more information  Narcotics Anonymous (NA), [facility] [address], [employer] [place]  2 meetings at this location   Brewing technologist  Notes  [facility] [address]    [place]  [phone]   [facility]  [address] [place] [phone], [place], [place]   [place], [place] [phone] Admissions: 8am-3pm M-F  Incentives Substance [place] [address]    [place], [place] [phone]   [facility] [address] [place], [place], [place]   [facility] [address],  [place], [place]   [facility] - Intensive Outpatient [address], [place], [place]   [facility] ([place].,  [place], [place] [phone] or [phone]   [facility]idential Treatment Services (RTS) [address] [place], [place] Accepts Medicaid  [facility] [place] [address],  [place] [phone] Substance Abuse/Addiction Treatment   [facility] Organization         Address  Phone  Notes  [facility]  [phone]   Domenic Schwab, PhD [address] Arlis Porta [place], [place]   [phone] or [phone]   [place], [place] [phone]   [facility] [address] [place], [place] [phone] Insurance/Medicaid/sponsorship through [facility] and Families [address], [address] [place]                                    [place], [place] [phone] [place] [address], [place] [phone]    Dr. Adele Schilder  [phone]   [facility] Dept. 1) [address], [place] 2) [place] 3)  [place] [address], [place] [phone]  [phone]   [place] [phone] or [phone] (After Hours)

## 2015-03-21 NOTE — ED Notes (Signed)
Dr.Yao at bedside. Pt walking with EDP, steady gait noted

## 2015-03-21 NOTE — ED Notes (Signed)
Pt from home with c/o dizziness starting yesterday worse with positional changes.  Pt reports the room feels like it's moving and is having difficulty walking.  No hx of vertigo or the same.  NAD, A&O

## 2015-03-21 NOTE — ED Provider Notes (Signed)
CSN: [id number]     Arrival date & time 03/21/15  1230 History   First MD Initiated Contact with Patient 03/21/15 1540     Chief Complaint  Patient presents with  . Dizziness     (Consider location/radiation/quality/duration/timing/severity/associated sxs/prior Treatment) Patient is a 69 y.o. male presenting with dizziness.  Dizziness Quality:  Room spinning Severity:  Severe Onset quality:  Sudden Duration:  1 day Timing:  Intermittent Chronicity:  New Context: bending over, head movement and standing up   Context: not with ear pain, not with eye movement, not with loss of consciousness and not with medication   Relieved by:  Being still and lying down Worsened by:  Movement, standing up and turning head Ineffective treatments:  None tried Associated symptoms: no chest pain, no diarrhea, no headaches, no hearing loss, no nausea, no palpitations, no shortness of breath, no syncope, no tinnitus, no vision changes, no vomiting and no weakness   Risk factors: no hx of stroke, no hx of vertigo and no new medications    Patient reports 1 day history of dizziness described as "the room spinning".  He has experienced approximately 2-3 episodes that each last about 10-15 minutes.  No head injury or trauma.  Past Medical History  Diagnosis Date  . Hypertension   . Acid reflux    Past Surgical History  Procedure Laterality Date  . Biceps tendon surgery Right   . Shoulder surgery     Family History  Problem Relation Age of Onset  . Cancer Father    Social History  Substance Use Topics  . Smoking status: Never Smoker   . Smokeless tobacco: None  . Alcohol Use: 1.2 oz/week    0 Standard drinks or equivalent, 2 Cans of beer per week    Review of Systems  HENT: Negative for ear pain, hearing loss and tinnitus.   Respiratory: Negative for shortness of breath.   Cardiovascular: Negative for chest pain, palpitations and syncope.  Gastrointestinal: Negative for nausea, vomiting,  abdominal pain and diarrhea.  Genitourinary: Negative for dysuria, frequency and hematuria.  Musculoskeletal: Positive for gait problem.  Neurological: Positive for dizziness. Negative for syncope, weakness and headaches.       Difficulty walking  All other systems reviewed and are negative.     Allergies  Review of patient's allergies indicates no known allergies.  Home Medications   Prior to Admission medications   Medication Sig Start Date End Date Taking? Authorizing Provider  aspirin 81 MG tablet Take 81 mg by mouth daily.   Yes Historical Provider, MD  hydrochlorothiazide (HYDRODIURIL) 25 MG tablet Take 1 tablet (25 mg total) by mouth daily. 02/15/15  Yes Roselee Culver, MD  lisinopril (PRINIVIL,ZESTRIL) 40 MG tablet Take 1 tablet (40 mg total) by mouth daily. 02/15/15  Yes Roselee Culver, MD  naproxen sodium (ANAPROX DS) 550 MG tablet Take 1 tablet (550 mg total) by mouth 2 (two) times daily with a meal. 02/15/15 02/15/16 Yes Roselee Culver, MD  omeprazole (PRILOSEC) 20 MG capsule Take 1 capsule (20 mg total) by mouth daily. Patient taking differently: Take 20 mg by mouth daily as needed (HEARTBURN).  10/29/14  Yes Roselee Culver, MD  traMADol (ULTRAM) 50 MG tablet Take 1 tablet (50 mg total) by mouth every 8 (eight) hours as needed. Patient taking differently: Take 50 mg by mouth every 8 (eight) hours as needed for moderate pain.  02/15/15  Yes Roselee Culver, MD  meclizine (ANTIVERT) 25 MG  tablet Take 1 tablet (25 mg total) by mouth 3 (three) times daily as needed for dizziness. 03/21/15   Gloriann Loan, PA-C  zoster vaccine live, PF, (ZOSTAVAX) 16109 UNT/0.65ML injection Inject 19,400 Units into the skin once. Patient not taking: Reported on 10/03/2014 08/20/14   [name], MD   BP 108/73 mmHg  Pulse 58  Temp(Src) 98 F (36.7 C) (Oral)  Resp 16  SpO2 100% Physical Exam  Constitutional: He is oriented to person, place, and time. He appears well-developed and  well-nourished.  HENT:  Head: Normocephalic and atraumatic.  Mouth/Throat: Oropharynx is clear and moist.  Eyes: Conjunctivae are normal. Pupils are equal, round, and reactive to light.  Neck: Normal range of motion. Neck supple.  Cardiovascular: Normal rate, regular rhythm and normal heart sounds.   No murmur heard. Pulmonary/Chest: Effort normal and breath sounds normal. No accessory muscle usage or stridor. No respiratory distress. He has no wheezes. He has no rhonchi. He has no rales.  Abdominal: Soft. Bowel sounds are normal. He exhibits no distension. There is no tenderness.  Musculoskeletal: Normal range of motion.  Lymphadenopathy:    He has no cervical adenopathy.  Neurological: He is alert and oriented to person, place, and time.  Mental Status:   AOx3.  Speech clear without dysarthria. Cranial Nerves:  I-not tested  II-PERRLA  III, IV, VI-EOMs intact  V-temporal and masseter strength intact  VII-symmetrical facial movements intact, no facial droop  VIII-hearing grossly intact bilaterally  IX, X-gag intact  XI-strength of sternomastoid and trapezius muscles 5/5  XII-tongue midline Motor:   Good muscle bulk and tone  Strength 5/5 bilaterally in upper and lower extremities   Cerebellar--RAMs, finger to nose intact             No pronator drift Sensory:  Intact in upper and lower extremities   Skin: Skin is warm and dry.  Psychiatric: He has a normal mood and affect. His behavior is normal.  Vitals reviewed.   ED Course  Procedures (including critical care time) Labs Review Labs Reviewed  COMPREHENSIVE METABOLIC PANEL - Abnormal; Notable for the following:    Glucose, Bld 136 (*)    All other components within normal limits  I-STAT CHEM 8, ED - Abnormal; Notable for the following:    Chloride 100 (*)    Glucose, Bld 131 (*)    All other components within normal limits  PROTIME-INR  APTT  CBC  DIFFERENTIAL  I-STAT TROPOININ, ED  CBG MONITORING, ED     Imaging Review Ct Head Wo Contrast  03/21/2015  CLINICAL DATA:  69 year old male with dizziness. Dropping blood pressure. History of hypertension. Initial encounter. EXAM: CT HEAD WITHOUT CONTRAST TECHNIQUE: Contiguous axial images were obtained from the base of the skull through the vertex without intravenous contrast. COMPARISON:  None. FINDINGS: No intracranial hemorrhage or CT evidence of large acute infarct. No intracranial mass lesion noted on this unenhanced exam. Prominent falx calcifications. No hydrocephalus. Mastoid air cells, middle ear cavities and visualized paranasal sinuses are clear. Orbital structures unremarkable. IMPRESSION: No intracranial hemorrhage or CT evidence of large acute infarct. Prominent falx calcifications. Electronically Signed   By: Genia Del M.D.   On: 03/21/2015 15:31   I have personally reviewed and evaluated these images and lab results as part of my medical decision-making.   EKG Interpretation   Date/Time:  Thursday March 21 2015 13:05:29 EST Ventricular Rate:  68 PR Interval:  202 QRS Duration: 90 QT Interval:  416 QTC  Calculation: 442 R Axis:   16 Text Interpretation:  Normal sinus rhythm Minimal voltage criteria for  LVH, may be normal variant Borderline ECG no significant change since 2007  Confirmed by GOLDSTON  MD, SCOTT ([id number]) on 03/21/2015 3:37:27 PM      MDM   Final diagnoses:  Dizziness    Patient presents with 1 day history of dizziness described as the room spinning with episodes that last about 10-15 minutes and relieved with lying still.  No hx of vertigo.  No recent med changes.  No head injury/trauma.  VSS, NAD.  Concern for BPPV, orthostatic, posterior CVA.  On exam, heart RRR, lungs CTAB, abdomen soft and benign.  No focal neurological deficits.  Labs unremarkable.  Head CT negative.  Will obtain orthostatic vitals and give meclizine.  He is not orthostatic.  Per Dr. Darl Householder, patient is ambulatory without difficulty, and  symptoms have resolved with meclizine.  Given history and presentation suspect BPPV.  Low suspicion for posterior CVA.  Evaluation does not show pathology requring ongoing emergent intervention or admission. Pt is hemodynamically stable and mentating appropriately. Discussed findings/results and plan with patient/guardian, who agrees with plan. All questions answered. Return precautions discussed and outpatient follow up given. Case has been discussed with and seen by Dr. Darl Householder who agrees with the above plan for discharge.    Gloriann Loan, PA-C 03/21/15 1730  Wandra Arthurs, MD 03/22/15 Berniece Salines

## 2015-04-06 ENCOUNTER — Ambulatory Visit (INDEPENDENT_AMBULATORY_CARE_PROVIDER_SITE_OTHER): Payer: Commercial Managed Care - HMO | Admitting: Family Medicine

## 2015-04-06 VITALS — BP 134/82 | HR 73 | Temp 98.0°F | Resp 16 | Ht 66.0 in | Wt 215.0 lb

## 2015-04-06 DIAGNOSIS — M5442 Lumbago with sciatica, left side: Secondary | ICD-10-CM

## 2015-04-06 DIAGNOSIS — M5432 Sciatica, left side: Secondary | ICD-10-CM

## 2015-04-06 DIAGNOSIS — K219 Gastro-esophageal reflux disease without esophagitis: Secondary | ICD-10-CM | POA: Diagnosis not present

## 2015-04-06 DIAGNOSIS — M4806 Spinal stenosis, lumbar region: Secondary | ICD-10-CM

## 2015-04-06 DIAGNOSIS — M48061 Spinal stenosis, lumbar region without neurogenic claudication: Secondary | ICD-10-CM

## 2015-04-06 MED ORDER — MELOXICAM 7.5 MG PO TABS: 7.5000 mg | ORAL_TABLET | Freq: Every day | ORAL | Status: DC

## 2015-04-06 MED ORDER — TRAMADOL HCL 50 MG PO TABS: 50.0000 mg | ORAL_TABLET | Freq: Three times a day (TID) | ORAL | Status: DC | PRN

## 2015-04-06 MED ORDER — OMEPRAZOLE 20 MG PO CPDR
20.0000 mg | DELAYED_RELEASE_CAPSULE | Freq: Every day | ORAL | Status: DC
Start: 2015-04-06 — End: 2017-06-20

## 2015-04-06 NOTE — Progress Notes (Signed)
Patient ID: John Stewart, male   DOB: 28-Oct-1945, 69 y.o.   MRN: [id number]   This chart was scribed for Robyn Haber, MD by [facility], medical scribe at [facility].The patient was seen in exam room 01 and the patient's care was started at 8:36 AM.  Patient ID: John Stewart MRN: [id number], DOB: 1945/07/05, 69 y.o. Date of Encounter: 04/06/2015  Primary Physician: No PCP Per Patient  Chief Complaint:  Chief Complaint  Patient presents with  . Medication Refill    tramadol, prilosec, naproxen   HPI:  John Stewart is a 69 y.o. male with a Hx of HTN  who presents to [facility] for a medication refill. Hx of left sided sciatica, takes tramadol for this. He needs this refilled. He would also like his Prilosec refilled. Retired for 3 years, worked at [employer] for 17 years.  Past Medical History  Diagnosis Date  . Hypertension   . Acid reflux     Home Meds: Prior to Admission medications   Medication Sig Start Date End Date Taking? Authorizing Provider  aspirin 81 MG tablet Take 81 mg by mouth daily.   Yes Historical Provider, MD  hydrochlorothiazide (HYDRODIURIL) 25 MG tablet Take 1 tablet (25 mg total) by mouth daily. 02/15/15  Yes Roselee Culver, MD  lisinopril (PRINIVIL,ZESTRIL) 40 MG tablet Take 1 tablet (40 mg total) by mouth daily. 02/15/15  Yes Roselee Culver, MD  meclizine (ANTIVERT) 25 MG tablet Take 1 tablet (25 mg total) by mouth 3 (three) times daily as needed for dizziness. 03/21/15  Yes Gloriann Loan, PA-C  naproxen sodium (ANAPROX DS) 550 MG tablet Take 1 tablet (550 mg total) by mouth 2 (two) times daily with a meal. 02/15/15 02/15/16 Yes Roselee Culver, MD  omeprazole (PRILOSEC) 20 MG capsule Take 1 capsule (20 mg total) by mouth daily. Patient taking differently: Take 20 mg by mouth daily as needed (HEARTBURN).  10/29/14  Yes Roselee Culver, MD  traMADol (ULTRAM) 50 MG tablet Take 1 tablet (50 mg total) by  mouth every 8 (eight) hours as needed. Patient taking differently: Take 50 mg by mouth every 8 (eight) hours as needed for moderate pain.  02/15/15  Yes Roselee Culver, MD  zoster vaccine live, PF, (ZOSTAVAX) 60454 UNT/0.65ML injection Inject 19,400 Units into the skin once. 08/20/14  Yes [name], MD   Allergies: No Known Allergies  Social History   Social History  . Marital Status: Married    Spouse Name: N/A  . Number of Children: N/A  . Years of Education: N/A   Occupational History  . Not on file.   Social History Main Topics  . Smoking status: Never Smoker   . Smokeless tobacco: Not on file  . Alcohol Use: 1.2 oz/week    0 Standard drinks or equivalent, 2 Cans of beer per week  . Drug Use: No  . Sexual Activity: Not on file   Other Topics Concern  . Not on file   Social History Narrative    Review of Systems: Constitutional: negative for chills, fever, night sweats, weight changes, or fatigue  HEENT: negative for vision changes, hearing loss, congestion, rhinorrhea, ST, epistaxis, or sinus pressure Cardiovascular: negative for chest pain or palpitations Respiratory: negative for hemoptysis, wheezing, shortness of breath, or cough Abdominal: negative for abdominal pain, nausea, vomiting, diarrhea, or constipation Dermatological: negative for rash Neurologic: negative for headache, dizziness, or syncope All other  systems reviewed and are otherwise negative with the exception to those above and in the HPI.  Physical Exam: Blood pressure 134/82, pulse 73, temperature 98 F (36.7 C), resp. rate 16, height 5\' 6"  (1.676 m), weight 215 lb (97.523 kg), SpO2 98 %., Body mass index is 34.72 kg/(m^2). General: Well developed, well nourished, in no acute distress. Head: Normocephalic, atraumatic, eyes without discharge, sclera non-icteric, nares are without discharge. Bilateral auditory canals clear, TM's are without perforation, pearly grey and translucent with  reflective cone of light bilaterally. Oral cavity moist, posterior pharynx without exudate, erythema, peritonsillar abscess, or post nasal drip.  Neck: Supple. No thyromegaly. Full ROM. No lymphadenopathy. Lungs: Clear bilaterally to auscultation without wheezes, rales, or rhonchi. Breathing is unlabored. Heart: RRR with S1 S2. No murmurs, rubs, or gallops appreciated. Abdomen: Soft, non-tender, non-distended with normoactive bowel sounds. No hepatomegaly. No rebound/guarding. No obvious abdominal masses. Msk:  Strength and tone normal for age. Tenderness in the left perispinal muscles in the lumbar region. Extremities/Skin: Warm and dry. No clubbing or cyanosis. No edema. No rashes or suspicious lesions. Neuro: Alert and oriented X 3. Moves all extremities spontaneously. Gait is normal. CNII-XII grossly in tact. Psych:  Responds to questions appropriately with a normal affect.   ASSESSMENT AND PLAN:  69 y.o. year old male with   By signing my name below, I, Nadim Abuhashem, attest that this documentation has been prepared under the direction and in the presence of Robyn Haber, MD.  Electronically Signed: Lora Havens, medical scribe. 04/06/2015 8:43 AM.   This chart was scribed in my presence and reviewed by me personally.    ICD-9-CM ICD-10-CM   1. Sciatica associated with disorder of lumbar spine, left 724.3 M54.32 traMADol (ULTRAM) 50 MG tablet     meloxicam (MOBIC) 7.5 MG tablet  2. Spinal stenosis of lumbar region 724.02 M48.06 traMADol (ULTRAM) 50 MG tablet     meloxicam (MOBIC) 7.5 MG tablet  3. Left-sided low back pain with left-sided sciatica 724.3 M54.42 traMADol (ULTRAM) 50 MG tablet     meloxicam (MOBIC) 7.5 MG tablet  4. Gastroesophageal reflux disease without esophagitis 530.81 K21.9 omeprazole (PRILOSEC) 20 MG capsule     Signed, Robyn Haber, MD

## 2015-04-06 NOTE — Patient Instructions (Signed)

## 2015-08-02 ENCOUNTER — Telehealth: Payer: Self-pay

## 2015-08-02 DIAGNOSIS — I10 Essential (primary) hypertension: Secondary | ICD-10-CM

## 2015-08-02 NOTE — Telephone Encounter (Signed)
John Stewart - Pt needs refills on lisinopril and hydrochlorothizide. [phone]

## 2015-08-05 MED ORDER — LISINOPRIL 40 MG PO TABS: 40.0000 mg | ORAL_TABLET | Freq: Every day | ORAL | Status: DC

## 2015-08-05 MED ORDER — HYDROCHLOROTHIAZIDE 25 MG PO TABS: 25.0000 mg | ORAL_TABLET | Freq: Every day | ORAL | Status: DC

## 2015-08-05 NOTE — Telephone Encounter (Signed)
Rx's sent in. Pt notified. 

## 2016-02-11 ENCOUNTER — Ambulatory Visit (INDEPENDENT_AMBULATORY_CARE_PROVIDER_SITE_OTHER): Payer: Commercial Managed Care - HMO | Admitting: Student

## 2016-02-11 ENCOUNTER — Encounter: Payer: Self-pay | Admitting: Student

## 2016-02-11 VITALS — BP 160/82 | HR 80 | Temp 98.2°F | Resp 16 | Ht 64.75 in | Wt 219.8 lb

## 2016-02-11 DIAGNOSIS — G8929 Other chronic pain: Secondary | ICD-10-CM | POA: Diagnosis not present

## 2016-02-11 DIAGNOSIS — M5442 Lumbago with sciatica, left side: Secondary | ICD-10-CM | POA: Diagnosis not present

## 2016-02-11 DIAGNOSIS — I10 Essential (primary) hypertension: Secondary | ICD-10-CM | POA: Diagnosis not present

## 2016-02-11 DIAGNOSIS — Z23 Encounter for immunization: Secondary | ICD-10-CM | POA: Diagnosis not present

## 2016-02-11 DIAGNOSIS — Z131 Encounter for screening for diabetes mellitus: Secondary | ICD-10-CM | POA: Diagnosis not present

## 2016-02-11 DIAGNOSIS — M48062 Spinal stenosis, lumbar region with neurogenic claudication: Secondary | ICD-10-CM

## 2016-02-11 DIAGNOSIS — Z79899 Other long term (current) drug therapy: Secondary | ICD-10-CM | POA: Diagnosis not present

## 2016-02-11 LAB — CBC
HEMATOCRIT: 43.4 % (ref 38.5–50.0)
Hemoglobin: 15.1 g/dL (ref 13.2–17.1)
MCH: 32.1 pg (ref 27.0–33.0)
MCHC: 34.8 g/dL (ref 32.0–36.0)
MCV: 92.3 fL (ref 80.0–100.0)
MPV: 9.4 fL (ref 7.5–12.5)
PLATELETS: 237 10*3/uL (ref 140–400)
RBC: 4.7 MIL/uL (ref 4.20–5.80)
RDW: 13.5 % (ref 11.0–15.0)
WBC: 5.5 10*3/uL (ref 3.8–10.8)

## 2016-02-11 LAB — COMPREHENSIVE METABOLIC PANEL
ALK PHOS: 55 U/L (ref 40–115)
ALT: 31 U/L (ref 9–46)
AST: 24 U/L (ref 10–35)
Albumin: 3.8 g/dL (ref 3.6–5.1)
BILIRUBIN TOTAL: 0.4 mg/dL (ref 0.2–1.2)
BUN: 12 mg/dL (ref 7–25)
CALCIUM: 9.1 mg/dL (ref 8.6–10.3)
CO2: 26 mmol/L (ref 20–31)
Chloride: 106 mmol/L (ref 98–110)
Creat: 1 mg/dL (ref 0.70–1.25)
GLUCOSE: 107 mg/dL — AB (ref 65–99)
POTASSIUM: 4 mmol/L (ref 3.5–5.3)
Sodium: 139 mmol/L (ref 135–146)
TOTAL PROTEIN: 6 g/dL — AB (ref 6.1–8.1)

## 2016-02-11 LAB — HEMOGLOBIN A1C
HEMOGLOBIN A1C: 5.3 % (ref <5.7)
Mean Plasma Glucose: 105 mg/dL

## 2016-02-11 MED ORDER — TRAMADOL HCL 50 MG PO TABS: 50.0000 mg | ORAL_TABLET | Freq: Three times a day (TID) | ORAL | 1 refills | Status: DC | PRN

## 2016-02-11 MED ORDER — AMLODIPINE BESYLATE 10 MG PO TABS: 10.0000 mg | ORAL_TABLET | Freq: Every day | ORAL | 1 refills | Status: DC

## 2016-02-11 MED ORDER — CHLORTHALIDONE 25 MG PO TABS: 25.0000 mg | ORAL_TABLET | Freq: Every day | ORAL | 1 refills | Status: DC

## 2016-02-11 MED ORDER — LISINOPRIL 40 MG PO TABS: 40.0000 mg | ORAL_TABLET | Freq: Every day | ORAL | 1 refills | Status: DC

## 2016-02-11 NOTE — Assessment & Plan Note (Addendum)
Uncontrolled currently, will change HCTZ to chlorthalidone and add norvasc to regimen.  Instructed not to start chlorthalidone until we check his kidneys.  Will call him with results and he can start chlorthalidone at that time.  In the meantime, continue HCTZ.  Reviewed side effects of norvasc.  Gave red flag signs to go to ED-chest pain, shortness of breath, numbness, tingling, weakness.  Follow up in 2 weeks for repeat bloodwork.  Today will get CMP, CBC, A1c.  He would like to do a fasting lipid panel, UA at next visit and will get a BMP at that time as well.  Recheck BP and consider other medications if still not controlled.

## 2016-02-11 NOTE — Progress Notes (Signed)
Subjective:    Patient ID: John Stewart, male    DOB: 1946-01-29, 70 y.o.   MRN: [id number]  HPI Presents for HTN follow up.  Took BP this morning, 212/120 then 210/110 taking it at home this morning when his nose started bleeding.  He took his wife's clonidine and it improved to 200/100.  He reported dizziness at that time, but has resolved.  Denies weakness, numbness, tingling, chest pain, shortness of breath, lower extremity edema.  Has been taking medications as prescribed.  No problems with kidneys in past.  He also complains of low back pain and has a history of a herniated disc and sciatica.  This bothers him from time to time and he will take tramadol to help with pain at night so he can sleep 1-2 times per week.  He has a history of ESI and would consider in the future if needed.  Denies weakness or numbness currently.  Active Ambulatory Problems    Diagnosis Date Noted  . Essential hypertension 02/11/2016  . Spinal stenosis of lumbar region with neurogenic claudication 02/11/2016  . Chronic left-sided low back pain with left-sided sciatica 02/11/2016   Resolved Ambulatory Problems    Diagnosis Date Noted  . No Resolved Ambulatory Problems   Past Medical History:  Diagnosis Date  . Acid reflux   . Hypertension      Current Outpatient Prescriptions:  .  aspirin 81 MG tablet, Take 81 mg by mouth daily., Disp: , Rfl:  .  lisinopril (PRINIVIL,ZESTRIL) 40 MG tablet, Take 1 tablet (40 mg total) by mouth daily., Disp: 90 tablet, Rfl: 1 .  meclizine (ANTIVERT) 25 MG tablet, Take 1 tablet (25 mg total) by mouth 3 (three) times daily as needed for dizziness., Disp: 30 tablet, Rfl: 0 .  meloxicam (MOBIC) 7.5 MG tablet, Take 1 tablet (7.5 mg total) by mouth daily., Disp: 30 tablet, Rfl: 0 .  omeprazole (PRILOSEC) 20 MG capsule, Take 1 capsule (20 mg total) by mouth daily., Disp: 90 capsule, Rfl: 1 .  traMADol (ULTRAM) 50 MG tablet, Take 1 tablet (50 mg total) by mouth every 8 (eight)  hours as needed., Disp: 50 tablet, Rfl: 1 .  amLODipine (NORVASC) 10 MG tablet, Take 1 tablet (10 mg total) by mouth daily., Disp: 90 tablet, Rfl: 1 .  chlorthalidone (HYGROTON) 25 MG tablet, Take 1 tablet (25 mg total) by mouth daily., Disp: 30 tablet, Rfl: 1 .  zoster vaccine live, PF, (ZOSTAVAX) 21308 UNT/0.65ML injection, Inject 19,400 Units into the skin once. (Patient not taking: Reported on 02/11/2016), Disp: 1 each, Rfl: 0   Review of Systems  Constitutional: Negative for chills, fatigue and fever.  HENT: Negative for congestion and sore throat.   Eyes: Negative for photophobia, redness and visual disturbance.  Respiratory: Negative for cough and shortness of breath.   Cardiovascular: Negative for chest pain and leg swelling.  Genitourinary: Negative for decreased urine volume and hematuria.  Musculoskeletal: Negative for joint swelling and myalgias.  Skin: Negative for pallor, rash and wound.  Neurological: Positive for dizziness. Negative for syncope, weakness, numbness and headaches.  Psychiatric/Behavioral: Negative for agitation and dysphoric mood.  All other systems reviewed and are negative.      Objective:   Physical Exam  Constitutional: He is oriented to person, place, and time. He appears well-developed and well-nourished. No distress.  HENT:  Head: Normocephalic and atraumatic.  Eyes: Conjunctivae are normal. No scleral icterus.  Neck: Normal range of motion. Neck supple.  Cardiovascular:  Normal rate and regular rhythm.  Exam reveals no gallop and no friction rub.   No murmur heard. Pulmonary/Chest: Effort normal and breath sounds normal.  Musculoskeletal: He exhibits no edema or deformity.  Neurological: He is alert and oriented to person, place, and time.  Seated SLR negative  Skin: Skin is warm. No rash noted. He is not diaphoretic. No erythema. No pallor.  Psychiatric: He has a normal mood and affect. His behavior is normal. Judgment and thought content  normal.     BP (!) 160/82   Pulse 80   Temp 98.2 F (36.8 C) (Oral)   Resp 16   Ht 5' 4.75" (1.645 m)   Wt 219 lb 12.8 oz (99.7 kg)   SpO2 96%   BMI 36.86 kg/m       Assessment & Plan:  Essential hypertension Uncontrolled currently, will change HCTZ to chlorthalidone and add norvasc to regimen.  Instructed not to start chlorthalidone until we check his kidneys.  Will call him with results and he can start chlorthalidone at that time.  In the meantime, continue HCTZ.  Reviewed side effects of norvasc.  Gave red flag signs to go to ED-chest pain, shortness of breath, numbness, tingling, weakness.  Follow up in 2 weeks for repeat bloodwork.  Today will get CMP, CBC, A1c.  He would like to do a fasting lipid panel, UA at next visit and will get a BMP at that time as well.  Recheck BP and consider other medications if still not controlled.  Spinal stenosis of lumbar region with neurogenic claudication Still bothering him from time to time, using tramadol for pain.  Would recommend against NSAIDs at this time due to elevated BP.  Follow up for this as needed.  Does not appear to be abusing or distributing medications.  Consider ESI, surgical consultation, PT in the future if needed.

## 2016-02-11 NOTE — Patient Instructions (Addendum)
  Please take chlorthalidone ONLY after I call you about your labs, you may want to wait to pick it up.  Please start taking norvasc as soon as possible.  You may have leg swelling after taking this.  Go to ED with weakness, numbness, tingling, chest pain, shortness of breath.  Please come back in 2 weeks after starting chlorthalidone for repeat labs.   IF you received an x-ray today, you will receive an invoice from [facility]. Please contact [facility] at [phone] with questions or concerns regarding your invoice.   IF you received labwork today, you will receive an invoice from [employer]. Please contact [facility] at [phone] with questions or concerns regarding your invoice.   Our billing staff will not be able to assist you with questions regarding bills from these companies.  You will be contacted with the lab results as soon as they are available. The fastest way to get your results is to activate your My Chart account. Instructions are located on the last page of this paperwork. If you have not heard from [place] regarding the results in 2 weeks, please contact this office.

## 2016-02-11 NOTE — Assessment & Plan Note (Signed)
Still bothering him from time to time, using tramadol for pain.  Would recommend against NSAIDs at this time due to elevated BP.  Follow up for this as needed.  Does not appear to be abusing or distributing medications.  Consider ESI, surgical consultation, PT in the future if needed.

## 2016-08-09 IMAGING — CT CT HEAD W/O CM
1 series · 16 of 30 positions shown, 20 images · non-contrast
Comparison: None.

CLINICAL DATA: 69-year-old male with dizziness. Dropping blood
pressure. History of hypertension. Initial encounter.

EXAM:
CT HEAD WITHOUT CONTRAST
TECHNIQUE: Contiguous axial images were obtained from the base of the skull
through the vertex without intravenous contrast.

[Series 2: head 5.0 h30s · axial · 0.46mm/px · z∈[+1333,+1478]mm · 16 of 33 slices shown, 20 images]
[im 2/33  brain]
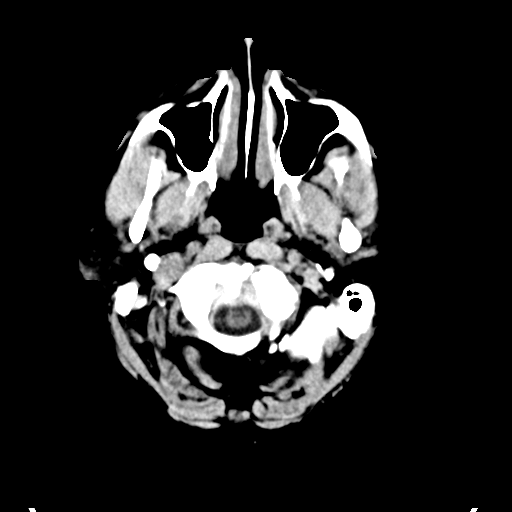
[im 2/33  bone]
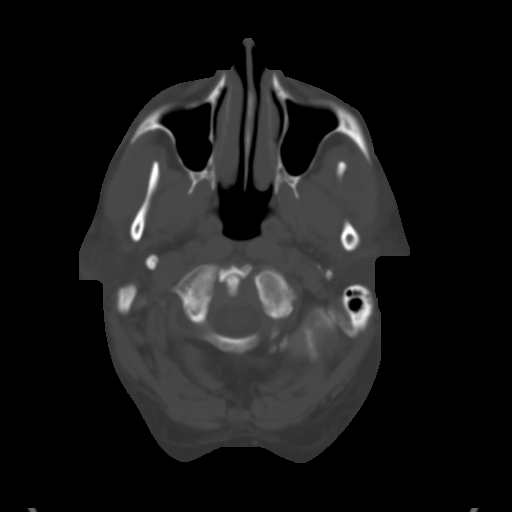
[im 4/33  brain]
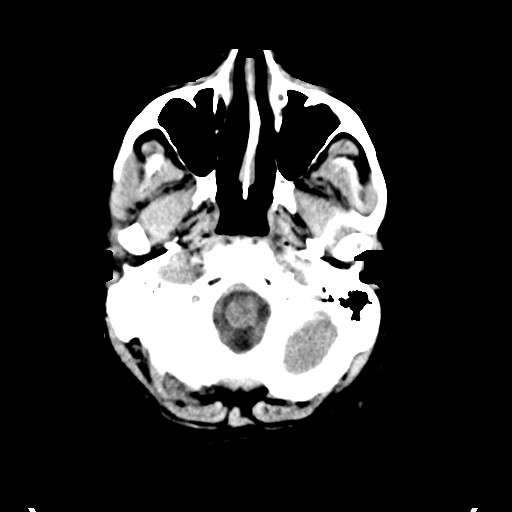
[im 6/33  brain]
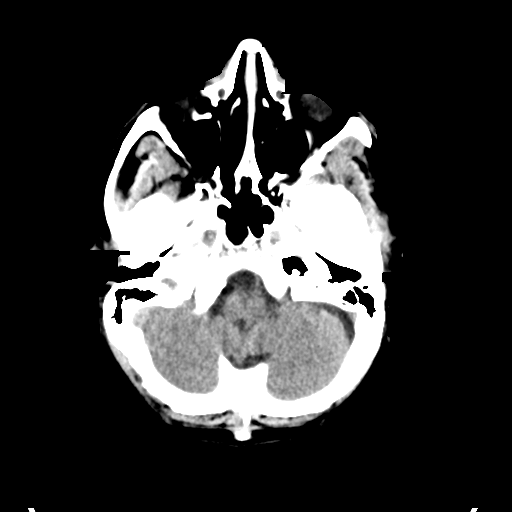
[im 8/33  brain]
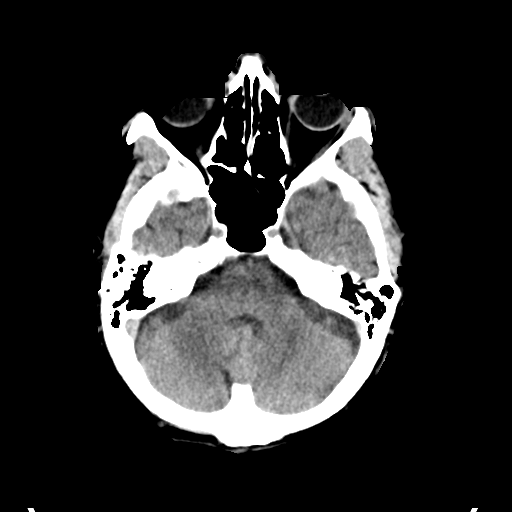
[im 9/33  brain]
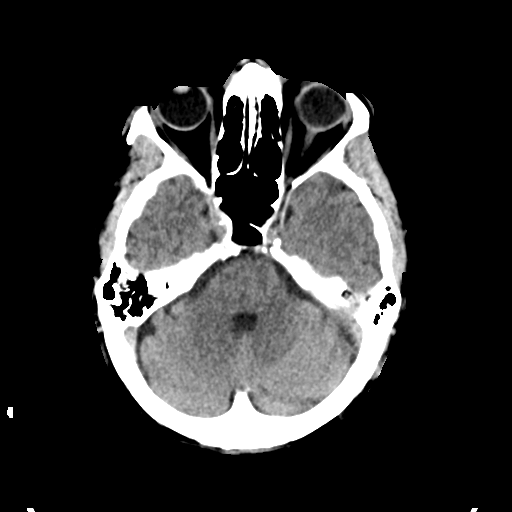
[im 9/33  bone]
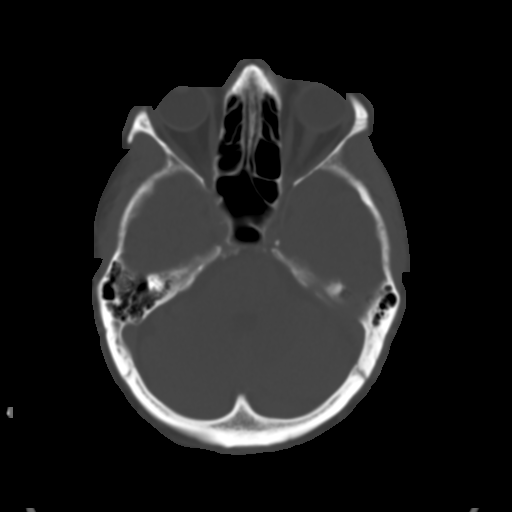
[im 12/33  brain]
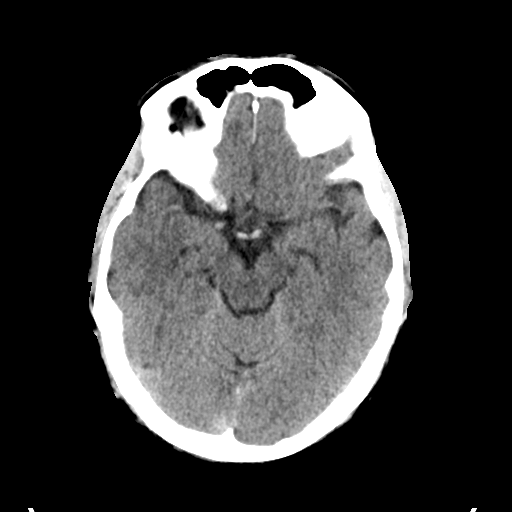
[im 14/33  brain]
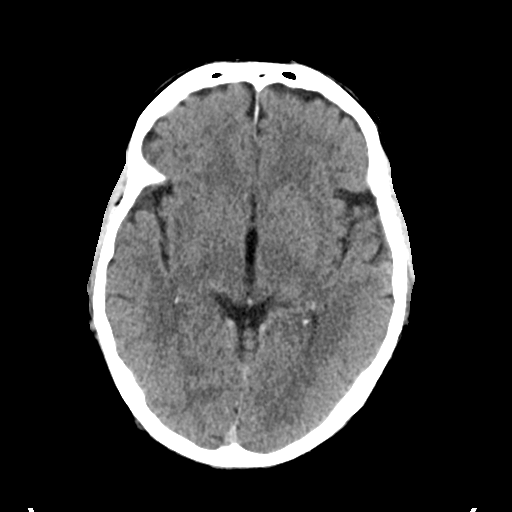
[im 16/33  brain]
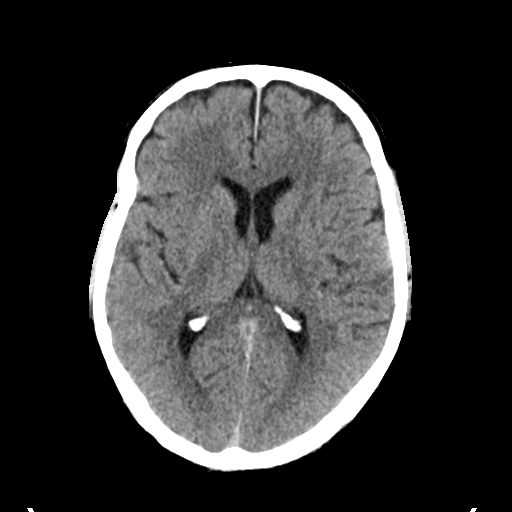
[im 17/33  brain]
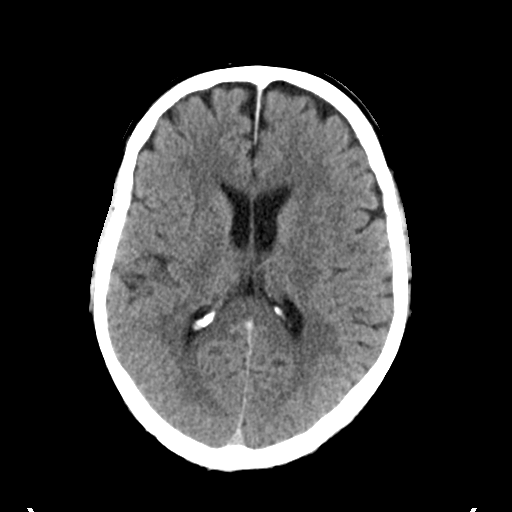
[im 17/33  bone]
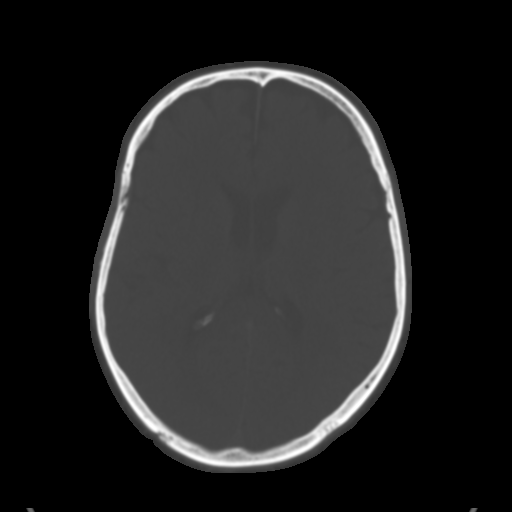
[im 19/33  brain]
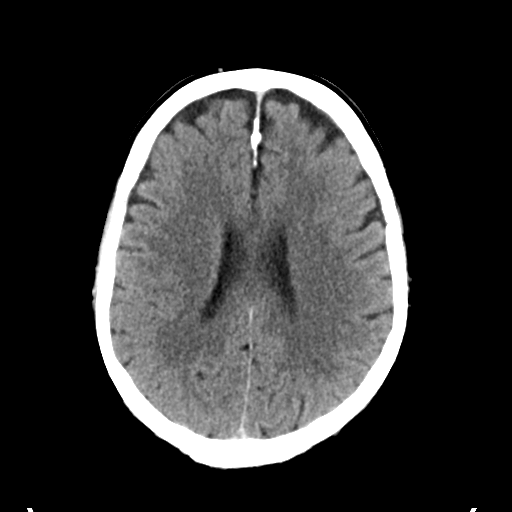
[im 21/33  brain]
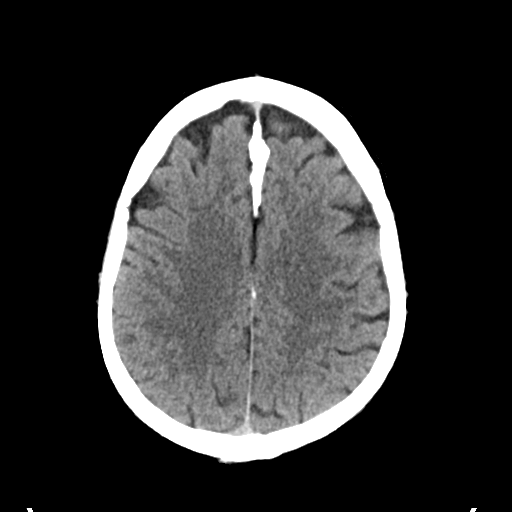
[im 24/33  brain]
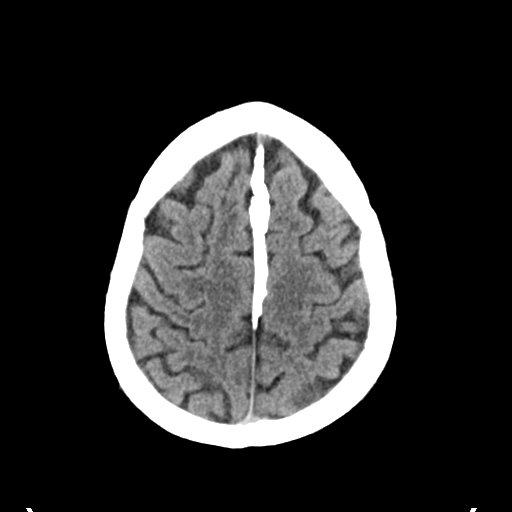
[im 25/33  brain]
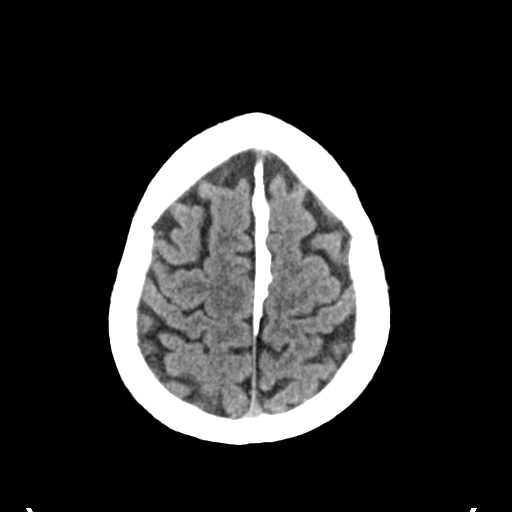
[im 25/33  bone]
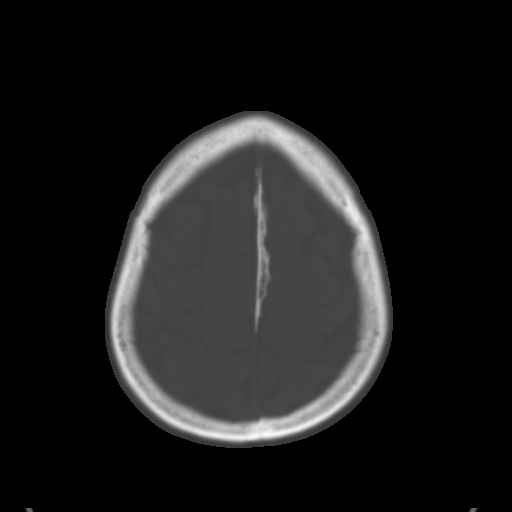
[im 27/33  brain]
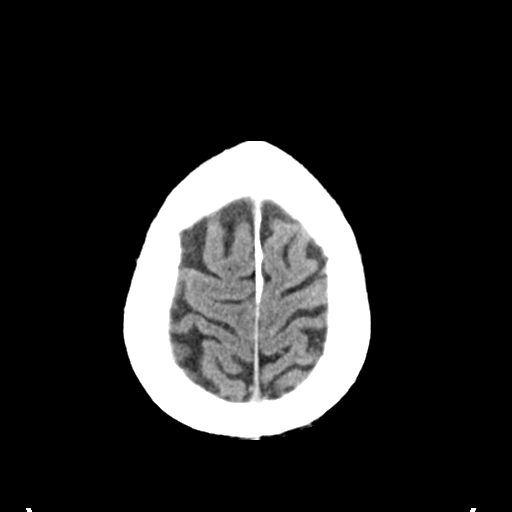
[im 29/33  brain]
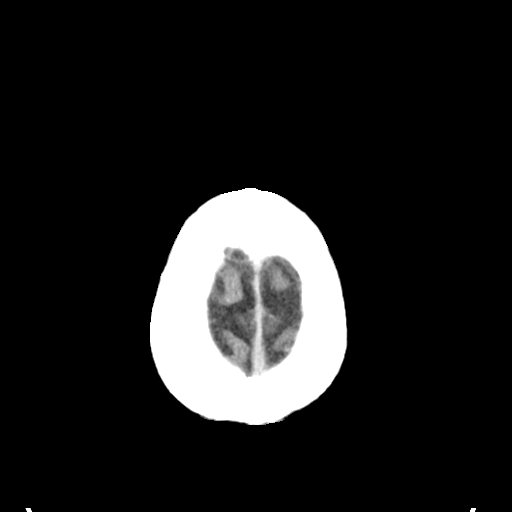
[im 31/33  brain]
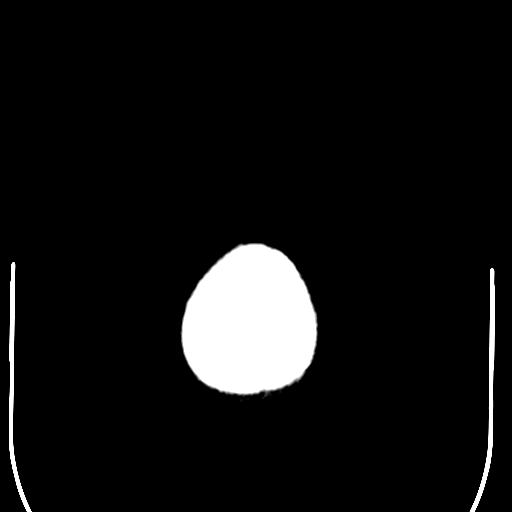

[16 of 30 positions shown; findings below may reference images not displayed]

FINDINGS: No intracranial hemorrhage or CT evidence of large acute infarct.

No intracranial mass lesion noted on this unenhanced exam.

Prominent falx calcifications.

No hydrocephalus.

Mastoid air cells, middle ear cavities and visualized paranasal
sinuses are clear.

Orbital structures unremarkable.
IMPRESSION: No intracranial hemorrhage or CT evidence of large acute infarct.

Prominent falx calcifications.

## 2016-10-05 DIAGNOSIS — M5417 Radiculopathy, lumbosacral region: Secondary | ICD-10-CM | POA: Diagnosis not present

## 2016-10-20 DIAGNOSIS — M4726 Other spondylosis with radiculopathy, lumbar region: Secondary | ICD-10-CM | POA: Diagnosis not present

## 2016-10-20 DIAGNOSIS — M5416 Radiculopathy, lumbar region: Secondary | ICD-10-CM | POA: Diagnosis not present

## 2016-12-21 DIAGNOSIS — H6980 Other specified disorders of Eustachian tube, unspecified ear: Secondary | ICD-10-CM | POA: Diagnosis not present

## 2017-05-18 DIAGNOSIS — I10 Essential (primary) hypertension: Secondary | ICD-10-CM | POA: Diagnosis not present

## 2017-05-18 DIAGNOSIS — J309 Allergic rhinitis, unspecified: Secondary | ICD-10-CM | POA: Diagnosis not present

## 2017-06-17 ENCOUNTER — Other Ambulatory Visit: Payer: Self-pay

## 2017-06-17 ENCOUNTER — Inpatient Hospital Stay (HOSPITAL_COMMUNITY)
Admission: EM | Admit: 2017-06-17 | Discharge: 2017-06-20 | DRG: 871 | Disposition: A | Discharge status: Home / Self Care | Payer: Medicare Other | Attending: Internal Medicine | Admitting: Internal Medicine

## 2017-06-17 ENCOUNTER — Emergency Department (HOSPITAL_COMMUNITY): Payer: Medicare Other

## 2017-06-17 ENCOUNTER — Encounter (HOSPITAL_COMMUNITY): Payer: Self-pay | Admitting: Emergency Medicine

## 2017-06-17 DIAGNOSIS — Z7982 Long term (current) use of aspirin: Secondary | ICD-10-CM | POA: Diagnosis not present

## 2017-06-17 DIAGNOSIS — A419 Sepsis, unspecified organism: Secondary | ICD-10-CM | POA: Diagnosis not present

## 2017-06-17 DIAGNOSIS — J181 Lobar pneumonia, unspecified organism: Secondary | ICD-10-CM | POA: Diagnosis not present

## 2017-06-17 DIAGNOSIS — K567 Ileus, unspecified: Secondary | ICD-10-CM | POA: Diagnosis not present

## 2017-06-17 DIAGNOSIS — Z7951 Long term (current) use of inhaled steroids: Secondary | ICD-10-CM | POA: Diagnosis not present

## 2017-06-17 DIAGNOSIS — R05 Cough: Secondary | ICD-10-CM | POA: Diagnosis not present

## 2017-06-17 DIAGNOSIS — K429 Umbilical hernia without obstruction or gangrene: Secondary | ICD-10-CM | POA: Diagnosis present

## 2017-06-17 DIAGNOSIS — Z79899 Other long term (current) drug therapy: Secondary | ICD-10-CM

## 2017-06-17 DIAGNOSIS — K56600 Partial intestinal obstruction, unspecified as to cause: Secondary | ICD-10-CM | POA: Diagnosis not present

## 2017-06-17 DIAGNOSIS — R109 Unspecified abdominal pain: Secondary | ICD-10-CM | POA: Diagnosis not present

## 2017-06-17 DIAGNOSIS — R911 Solitary pulmonary nodule: Secondary | ICD-10-CM | POA: Diagnosis not present

## 2017-06-17 DIAGNOSIS — K219 Gastro-esophageal reflux disease without esophagitis: Secondary | ICD-10-CM | POA: Diagnosis present

## 2017-06-17 DIAGNOSIS — J189 Pneumonia, unspecified organism: Secondary | ICD-10-CM | POA: Diagnosis present

## 2017-06-17 DIAGNOSIS — R140 Abdominal distension (gaseous): Secondary | ICD-10-CM | POA: Diagnosis present

## 2017-06-17 DIAGNOSIS — R5383 Other fatigue: Secondary | ICD-10-CM | POA: Diagnosis not present

## 2017-06-17 DIAGNOSIS — J180 Bronchopneumonia, unspecified organism: Secondary | ICD-10-CM | POA: Diagnosis not present

## 2017-06-17 DIAGNOSIS — K56609 Unspecified intestinal obstruction, unspecified as to partial versus complete obstruction: Secondary | ICD-10-CM

## 2017-06-17 DIAGNOSIS — J9601 Acute respiratory failure with hypoxia: Secondary | ICD-10-CM | POA: Diagnosis not present

## 2017-06-17 DIAGNOSIS — R509 Fever, unspecified: Secondary | ICD-10-CM | POA: Diagnosis present

## 2017-06-17 DIAGNOSIS — I10 Essential (primary) hypertension: Secondary | ICD-10-CM | POA: Diagnosis not present

## 2017-06-17 DIAGNOSIS — R1084 Generalized abdominal pain: Secondary | ICD-10-CM | POA: Diagnosis not present

## 2017-06-17 LAB — CBC
HEMATOCRIT: 41.7 % (ref 39.0–52.0)
HEMOGLOBIN: 14.7 g/dL (ref 13.0–17.0)
MCH: 32.4 pg (ref 26.0–34.0)
MCHC: 35.3 g/dL (ref 30.0–36.0)
MCV: 91.9 fL (ref 78.0–100.0)
Platelets: 302 10*3/uL (ref 150–400)
RBC: 4.54 MIL/uL (ref 4.22–5.81)
RDW: 12.4 % (ref 11.5–15.5)
WBC: 12.4 10*3/uL — ABNORMAL HIGH (ref 4.0–10.5)

## 2017-06-17 LAB — COMPREHENSIVE METABOLIC PANEL
ALBUMIN: 3.3 g/dL — AB (ref 3.5–5.0)
ALK PHOS: 86 U/L (ref 38–126)
ALT: 64 U/L — ABNORMAL HIGH (ref 17–63)
ANION GAP: 14 (ref 5–15)
AST: 44 U/L — ABNORMAL HIGH (ref 15–41)
BUN: 10 mg/dL (ref 6–20)
CALCIUM: 9.2 mg/dL (ref 8.9–10.3)
CHLORIDE: 96 mmol/L — AB (ref 101–111)
CO2: 22 mmol/L (ref 22–32)
Creatinine, Ser: 1.04 mg/dL (ref 0.61–1.24)
GFR calc Af Amer: >60 mL/min (ref >60)
GFR calc non Af Amer: >60 mL/min (ref >60)
GLUCOSE: 123 mg/dL — AB (ref 65–99)
POTASSIUM: 3.6 mmol/L (ref 3.5–5.1)
SODIUM: 132 mmol/L — AB (ref 135–145)
Total Bilirubin: 1.1 mg/dL (ref 0.3–1.2)
Total Protein: 7.1 g/dL (ref 6.5–8.1)

## 2017-06-17 LAB — LIPASE, BLOOD: LIPASE: 23 U/L (ref 11–51)

## 2017-06-17 LAB — URINALYSIS, ROUTINE W REFLEX MICROSCOPIC
BACTERIA UA: NONE SEEN
Bilirubin Urine: NEGATIVE
Glucose, UA: NEGATIVE mg/dL
Ketones, ur: 20 mg/dL — AB
Leukocytes, UA: NEGATIVE
Nitrite: NEGATIVE
PROTEIN: 100 mg/dL — AB
SQUAMOUS EPITHELIAL / LPF: NONE SEEN
Specific Gravity, Urine: 1.019 (ref 1.005–1.030)
pH: 5 (ref 5.0–8.0)

## 2017-06-17 LAB — I-STAT CG4 LACTIC ACID, ED
LACTIC ACID, VENOUS: 1.21 mmol/L (ref 0.5–1.9)
Lactic Acid, Venous: 1.52 mmol/L (ref 0.5–1.9)

## 2017-06-17 MED ORDER — SODIUM CHLORIDE 0.9 % IV SOLN
1.0000 g | Freq: Once | INTRAVENOUS | Status: AC
  Administered 2017-06-18: 1 g via INTRAVENOUS
  Filled 2017-06-17: qty 10

## 2017-06-17 MED ORDER — ACETAMINOPHEN 325 MG PO TABS
650.0000 mg | ORAL_TABLET | Freq: Once | ORAL | Status: AC
  Administered 2017-06-17: 650 mg via ORAL
  Filled 2017-06-17: qty 2

## 2017-06-17 MED ORDER — AZITHROMYCIN 250 MG PO TABS
500.0000 mg | ORAL_TABLET | Freq: Once | ORAL | Status: AC
  Administered 2017-06-18: 500 mg via ORAL
  Filled 2017-06-17: qty 2

## 2017-06-17 MED ORDER — SODIUM CHLORIDE 0.9 % IV BOLUS (SEPSIS)
1000.0000 mL | Freq: Once | INTRAVENOUS | Status: AC
  Administered 2017-06-18: 1000 mL via INTRAVENOUS

## 2017-06-17 MED ORDER — IOPAMIDOL (ISOVUE-300) INJECTION 61%
INTRAVENOUS | Status: AC
  Administered 2017-06-17: 100 mL
  Filled 2017-06-17: qty 100

## 2017-06-17 NOTE — ED Provider Notes (Signed)
[facility] EMERGENCY DEPARTMENT Provider Note   CSN: [id number] Arrival date & time: 06/17/17  1944     History   Chief Complaint Chief Complaint  Patient presents with  . sent by PCP    HPI John Stewart is a 72 y.o. male.  HPI   Patient is a 72 year old male presenting from PCP for ihospitalization.  Patient had 3-4 days of cough, fever.  He thought he had the flu.  But he went to get checked out today.  Patient also complaining of abdominal distention, abdominal pain.  Patient has not made flatus in the last day.  No vomiting.  But a lot of distention and gaseous feeling.  Patient endorses fever.  Lack of appetite.  Past Medical History:  Diagnosis Date  . Acid reflux   . Hypertension     Patient Active Problem List   Diagnosis Date Noted  . Essential hypertension 02/11/2016  . Spinal stenosis of lumbar region with neurogenic claudication 02/11/2016  . Chronic left-sided low back pain with left-sided sciatica 02/11/2016    Past Surgical History:  Procedure Laterality Date  . biceps tendon surgery Right   . SHOULDER SURGERY         Home Medications    Prior to Admission medications   Medication Sig Start Date End Date Taking? Authorizing Provider  amLODipine (NORVASC) 10 MG tablet Take 1 tablet (10 mg total) by mouth daily. 02/11/16  Yes Chitanand, Alicia B, DO  aspirin 81 MG tablet Take 81 mg by mouth daily.   Yes [provider]  chlorthalidone (HYGROTON) 25 MG tablet Take 1 tablet (25 mg total) by mouth daily. 02/11/16  Yes Chitanand, Alicia B, DO  fluticasone (FLONASE) 50 MCG/ACT nasal spray Place 1 spray into both nostrils daily.  05/18/17  Yes [provider]  lisinopril (PRINIVIL,ZESTRIL) 40 MG tablet Take 1 tablet (40 mg total) by mouth daily. 02/11/16  Yes Chitanand, Deirdre Evener, DO  meclizine (ANTIVERT) 25 MG tablet Take 1 tablet (25 mg total) by mouth 3 (three) times daily as needed for dizziness. Patient not  taking: Reported on 06/17/2017 03/21/15   Gloriann Loan, PA-C  meloxicam (MOBIC) 7.5 MG tablet Take 1 tablet (7.5 mg total) by mouth daily. Patient not taking: Reported on 06/17/2017 04/06/15   Robyn Haber, MD  omeprazole (PRILOSEC) 20 MG capsule Take 1 capsule (20 mg total) by mouth daily. Patient not taking: Reported on 06/17/2017 04/06/15   Robyn Haber, MD  traMADol (ULTRAM) 50 MG tablet Take 1 tablet (50 mg total) by mouth every 8 (eight) hours as needed. Patient not taking: Reported on 06/17/2017 95/09/32   Chitanand, Elmo Putt B, DO  zoster vaccine live, PF, (ZOSTAVAX) 67124 UNT/0.65ML injection Inject 19,400 Units into the skin once. Patient not taking: Reported on 02/11/2016 08/20/14   [name], MD    Family History Family History  Problem Relation Age of Onset  . Cancer Father     Social History Social History   Tobacco Use  . Smoking status: Never Smoker  Substance Use Topics  . Alcohol use: Yes    Alcohol/week: 1.2 oz    Types: 2 Cans of beer per week  . Drug use: No     Allergies   Patient has no known allergies.   Review of Systems Review of Systems  Constitutional: Positive for fatigue and fever. Negative for activity change.  Respiratory: Positive for cough. Negative for shortness of breath.   Cardiovascular: Negative for chest pain.  Gastrointestinal: Positive for abdominal distention, abdominal pain and constipation. Negative for diarrhea and vomiting.     Physical Exam Updated Vital Signs BP (!) 177/93 (BP Location: Right Arm)   Pulse 99   Temp (!) 102.9 F (39.4 C) (Oral) Comment: RN notified   Resp 18   Ht 5\' 5"  (1.651 m)   Wt 93.4 kg (206 lb)   SpO2 99%   BMI 34.28 kg/m   Physical Exam  Constitutional: He is oriented to person, place, and time. He appears well-nourished.  HENT:  Head: Normocephalic.  Eyes: Conjunctivae are normal.  Cardiovascular: Normal rate.  Pulmonary/Chest: Effort normal. No respiratory distress.  Mild rhonchi   Abdominal: He exhibits distension. There is tenderness.  Distended abdomen, diffuse tenderness.  Neurological: He is oriented to person, place, and time.  Skin: Skin is warm and dry. He is not diaphoretic.  Psychiatric: He has a normal mood and affect. His behavior is normal.     ED Treatments / Results  Labs (all labs ordered are listed, but only abnormal results are displayed) Labs Reviewed  COMPREHENSIVE METABOLIC PANEL - Abnormal; Notable for the following components:      Result Value   Sodium 132 (*)    Chloride 96 (*)    Glucose, Bld 123 (*)    Albumin 3.3 (*)    AST 44 (*)    ALT 64 (*)    All other components within normal limits  CBC - Abnormal; Notable for the following components:   WBC 12.4 (*)    All other components within normal limits  URINALYSIS, ROUTINE W REFLEX MICROSCOPIC - Abnormal; Notable for the following components:   Color, Urine AMBER (*)    APPearance HAZY (*)    Hgb urine dipstick MODERATE (*)    Ketones, ur 20 (*)    Protein, ur 100 (*)    All other components within normal limits  LIPASE, BLOOD  INFLUENZA PANEL BY PCR (TYPE A & B)  I-STAT CG4 LACTIC ACID, ED  I-STAT CG4 LACTIC ACID, ED    EKG  EKG Interpretation None       Radiology Dg Chest 2 View  Result Date: 06/17/2017 CLINICAL DATA:  Dry cough, dyspnea and fever x4 days. EXAM: CHEST - 2 VIEW COMPARISON:  None. FINDINGS: Heart is top-normal in size. No aortic aneurysm is noted. Patchy alveolar opacities are seen in the left upper lobe and lingula consistent with pneumonia are identified. Right lung is clear. No acute osseous abnormality is seen. High-riding right humeral head may reflect a chronic right rotator cuff tear. IMPRESSION: Patchy airspace opacities in the left upper lobe and lingula consistent with pneumonia. Electronically Signed   By: Ashley Royalty M.D.   On: 06/17/2017 22:36    Procedures Procedures (including critical care time)  Medications Ordered in  ED Medications  acetaminophen (TYLENOL) tablet 650 mg (not administered)     Initial Impression / Assessment and Plan / ED Course  I have reviewed the triage vital signs and the nursing notes.  Pertinent labs & imaging results that were available during my care of the patient were reviewed by me and considered in my medical decision making (see chart for details).    Patient is a 72 year old male presenting from PCP for ihospitalization.  Patient had 3-4 days of cough, fever.  He thought he had the flu.  But he went to get checked out today.  Patient also complaining of abdominal distention, abdominal pain.  Patient has not made  flatus in the last day.  No vomiting.  But a lot of distention and gaseous feeling.  Patient endorses fever.  Lack of appetite.  10:44 PM Will get x-ray, CT abdomen pelvis.  And treat patient's fever.  Signed out to oncoming provider, follow up CT. Patient not tachypnic, sating well on RA. Treated for CAP.  Final Clinical Impressions(s) / ED Diagnoses   Final diagnoses:  None    ED Discharge Orders    None       Bayard More, Fredia Sorrow, MD 06/22/17 0720

## 2017-06-17 NOTE — ED Triage Notes (Signed)
Pt states he was sent to ED by PCP for left side pna and bowel obstruction. Pt have abd pain 7/10 with cough and palpation. Last BM 3 days ago very small.

## 2017-06-18 ENCOUNTER — Encounter (HOSPITAL_COMMUNITY): Payer: Self-pay | Admitting: Internal Medicine

## 2017-06-18 ENCOUNTER — Observation Stay (HOSPITAL_COMMUNITY): Payer: Medicare Other

## 2017-06-18 DIAGNOSIS — R109 Unspecified abdominal pain: Secondary | ICD-10-CM | POA: Diagnosis not present

## 2017-06-18 DIAGNOSIS — K219 Gastro-esophageal reflux disease without esophagitis: Secondary | ICD-10-CM | POA: Diagnosis not present

## 2017-06-18 DIAGNOSIS — J181 Lobar pneumonia, unspecified organism: Secondary | ICD-10-CM

## 2017-06-18 DIAGNOSIS — A419 Sepsis, unspecified organism: Secondary | ICD-10-CM | POA: Diagnosis not present

## 2017-06-18 DIAGNOSIS — R140 Abdominal distension (gaseous): Secondary | ICD-10-CM | POA: Diagnosis present

## 2017-06-18 DIAGNOSIS — R911 Solitary pulmonary nodule: Secondary | ICD-10-CM | POA: Diagnosis present

## 2017-06-18 DIAGNOSIS — J9601 Acute respiratory failure with hypoxia: Secondary | ICD-10-CM | POA: Diagnosis present

## 2017-06-18 DIAGNOSIS — I10 Essential (primary) hypertension: Secondary | ICD-10-CM

## 2017-06-18 HISTORY — DX: Lobar pneumonia, unspecified organism: J18.1

## 2017-06-18 HISTORY — DX: Sepsis, unspecified organism: A41.9

## 2017-06-18 HISTORY — DX: Abdominal distension (gaseous): R14.0

## 2017-06-18 LAB — STREP PNEUMONIAE URINARY ANTIGEN: STREP PNEUMO URINARY ANTIGEN: NEGATIVE

## 2017-06-18 LAB — INFLUENZA PANEL BY PCR (TYPE A & B)
Influenza A By PCR: NEGATIVE
Influenza B By PCR: NEGATIVE

## 2017-06-18 LAB — PROTIME-INR
INR: 1.14
Prothrombin Time: 14.5 seconds (ref 11.4–15.2)

## 2017-06-18 LAB — APTT: aPTT: 31 seconds (ref 24–36)

## 2017-06-18 LAB — LACTIC ACID, PLASMA: LACTIC ACID, VENOUS: 1.5 mmol/L (ref 0.5–1.9)

## 2017-06-18 LAB — HIV ANTIBODY (ROUTINE TESTING W REFLEX): HIV SCREEN 4TH GENERATION: NONREACTIVE

## 2017-06-18 LAB — PROCALCITONIN: PROCALCITONIN: 1.12 ng/mL

## 2017-06-18 MED ORDER — POLYETHYLENE GLYCOL 3350 17 G PO PACK
17.0000 g | PACK | Freq: Every day | ORAL | Status: DC
  Administered 2017-06-18 – 2017-06-20 (×3): 17 g via ORAL
  Filled 2017-06-18 (×3): qty 1

## 2017-06-18 MED ORDER — ENOXAPARIN SODIUM 40 MG/0.4ML ~~LOC~~ SOLN
40.0000 mg | SUBCUTANEOUS | Status: DC
Start: 2017-06-18 — End: 2017-06-20
  Administered 2017-06-18 – 2017-06-19 (×2): 40 mg via SUBCUTANEOUS
  Filled 2017-06-18 (×2): qty 0.4

## 2017-06-18 MED ORDER — HYDRALAZINE HCL 20 MG/ML IJ SOLN: 5.0000 mg | INTRAMUSCULAR | Status: DC | PRN

## 2017-06-18 MED ORDER — ACETAMINOPHEN 325 MG PO TABS
650.0000 mg | ORAL_TABLET | Freq: Four times a day (QID) | ORAL | Status: DC | PRN
  Administered 2017-06-18 – 2017-06-19 (×3): 650 mg via ORAL
  Filled 2017-06-18 (×3): qty 2

## 2017-06-18 MED ORDER — SODIUM CHLORIDE 0.9 % IV BOLUS (SEPSIS)
1500.0000 mL | Freq: Once | INTRAVENOUS | Status: AC
  Administered 2017-06-18: 1500 mL via INTRAVENOUS

## 2017-06-18 MED ORDER — ZOLPIDEM TARTRATE 5 MG PO TABS
5.0000 mg | ORAL_TABLET | Freq: Every evening | ORAL | Status: DC | PRN
Start: 2017-06-18 — End: 2017-06-20

## 2017-06-18 MED ORDER — ASPIRIN 81 MG PO CHEW
81.0000 mg | CHEWABLE_TABLET | Freq: Every day | ORAL | Status: DC
  Administered 2017-06-18 – 2017-06-20 (×3): 81 mg via ORAL
  Filled 2017-06-18 (×3): qty 1

## 2017-06-18 MED ORDER — CEFTRIAXONE SODIUM 1 G IJ SOLR
1.0000 g | INTRAMUSCULAR | Status: DC
  Administered 2017-06-18 – 2017-06-19 (×2): 1 g via INTRAVENOUS
  Filled 2017-06-18 (×3): qty 10

## 2017-06-18 MED ORDER — DM-GUAIFENESIN ER 30-600 MG PO TB12
1.0000 | ORAL_TABLET | Freq: Two times a day (BID) | ORAL | Status: DC | PRN
  Administered 2017-06-18 – 2017-06-19 (×2): 1 via ORAL
  Filled 2017-06-18 (×2): qty 1

## 2017-06-18 MED ORDER — SODIUM CHLORIDE 0.9 % IV SOLN
500.0000 mg | INTRAVENOUS | Status: DC
  Administered 2017-06-19 (×2): 500 mg via INTRAVENOUS
  Filled 2017-06-18 (×2): qty 500

## 2017-06-18 MED ORDER — ALBUTEROL SULFATE (2.5 MG/3ML) 0.083% IN NEBU: 2.5000 mg | INHALATION_SOLUTION | RESPIRATORY_TRACT | Status: DC | PRN

## 2017-06-18 MED ORDER — ONDANSETRON HCL 4 MG/2ML IJ SOLN: 4.0000 mg | Freq: Three times a day (TID) | INTRAMUSCULAR | Status: DC | PRN

## 2017-06-18 MED ORDER — SODIUM CHLORIDE 0.9 % IV SOLN
INTRAVENOUS | Status: DC
  Administered 2017-06-18 – 2017-06-19 (×3): via INTRAVENOUS

## 2017-06-18 MED ORDER — FLUTICASONE PROPIONATE 50 MCG/ACT NA SUSP
1.0000 | Freq: Every day | NASAL | Status: DC
  Administered 2017-06-18: 1 via NASAL
  Filled 2017-06-18: qty 16

## 2017-06-18 NOTE — ED Notes (Signed)
Pt ambulating in hall without problem.  No distress noted.

## 2017-06-18 NOTE — ED Notes (Signed)
Pt ambulated in the hall with stand-by assistance. No shortness of breath or dizziness noted.

## 2017-06-18 NOTE — H&P (Signed)
History and Physical    John Stewart GHW:[id number] DOB: 05-01-1945 DOA: 06/17/2017  Referring MD/NP/PA:   PCP: Patient, No Pcp Per   Patient coming from:  The patient is coming from home.  At baseline, pt is independent for most of ADL.     Chief Complaint: Fever, chills, cough, abdominal distention  HPI: John Stewart is a 72 y.o. male with medical history significant of hypertension, GERD, who presents with fever, chills, cough, abdominal distention.  Patient states that he has been having fever, chills, cough in the past 3 days, which has worsened today. He coughs up greenish colored sputum. He does not have chest pain or shortness of breath. He has runny nose which is chronic issue, and has not changed significantly. He also reports abdominal distention and mild diffused abdominal discomfort, no nausea, vomiting diarrhea. His last bowel movement was 2 days ago. No symptoms of UTI or unilateral weakness.  ED Course: pt was found to have WBC 12.4, lactic acid 1.21, 1.52, negative urinalysis, negative Flu pcr, lipase 23, electrolytes renal function okay, temperature 102.9, tachycardia, no tachypnea, oxygen saturation 86% on room air, chest x-ray showed infiltration in left lower lobe and lingular lobe. Patient is placed on telemetry bed for observation.  # CT-abd/pelvis showed:  1. Few fluid-filled slightly enlarged loops of small bowel within the anterior abdomen but without convincing small bowel obstruction at this time. 2. 12 mm calcified lung nodule at the right base which may reflect a granuloma or hamartoma. 3. Fat containing umbilical and periumbilical hernia   Review of Systems:   General: has fevers, chills, no body weight gain, has poor appetite, has fatigue HEENT: no blurry vision, hearing changes or sore throat Respiratory: has dyspnea, coughing, no wheezing CV: no chest pain, no palpitations GI: no nausea, vomiting, has abdominal distension and pain, no diarrhea,  constipation GU: no dysuria, burning on urination, increased urinary frequency, hematuria  Ext: no leg edema Neuro: no unilateral weakness, numbness, or tingling, no vision change or hearing loss Skin: no rash, no skin tear. MSK: No muscle spasm, no deformity, no limitation of range of movement in spin Heme: No easy bruising.  Travel history: No recent long distant travel.  Allergy: No Known Allergies  Past Medical History:  Diagnosis Date  . Acid reflux   . Hypertension     Past Surgical History:  Procedure Laterality Date  . biceps tendon surgery Right   . SHOULDER SURGERY      Social History:  reports that  has never smoked. He does not have any smokeless tobacco history on file. He reports that he drinks about 1.2 oz of alcohol per week. He reports that he does not use drugs.  Family History:  Family History  Problem Relation Age of Onset  . Cancer Father      Prior to Admission medications   Medication Sig Start Date End Date Taking? Authorizing Provider  amLODipine (NORVASC) 10 MG tablet Take 1 tablet (10 mg total) by mouth daily. 02/11/16  Yes Chitanand, Alicia B, DO  aspirin 81 MG tablet Take 81 mg by mouth daily.   Yes [provider]  chlorthalidone (HYGROTON) 25 MG tablet Take 1 tablet (25 mg total) by mouth daily. 02/11/16  Yes Chitanand, Alicia B, DO  fluticasone (FLONASE) 50 MCG/ACT nasal spray Place 1 spray into both nostrils daily.  05/18/17  Yes [provider]  lisinopril (PRINIVIL,ZESTRIL) 40 MG tablet Take 1 tablet (40 mg total) by mouth daily. 02/11/16  Yes Chitanand, Alicia B, DO  meclizine (ANTIVERT) 25 MG tablet Take 1 tablet (25 mg total) by mouth 3 (three) times daily as needed for dizziness. Patient not taking: Reported on 06/17/2017 03/21/15   Gloriann Loan, PA-C  meloxicam (MOBIC) 7.5 MG tablet Take 1 tablet (7.5 mg total) by mouth daily. Patient not taking: Reported on 06/17/2017 04/06/15   Robyn Haber, MD  omeprazole (PRILOSEC) 20  MG capsule Take 1 capsule (20 mg total) by mouth daily. Patient not taking: Reported on 06/17/2017 04/06/15   Robyn Haber, MD  traMADol (ULTRAM) 50 MG tablet Take 1 tablet (50 mg total) by mouth every 8 (eight) hours as needed. Patient not taking: Reported on 06/17/2017 03/47/42   Chitanand, Elmo Putt B, DO  zoster vaccine live, PF, (ZOSTAVAX) 59563 UNT/0.65ML injection Inject 19,400 Units into the skin once. Patient not taking: Reported on 02/11/2016 08/20/14   [name], MD    Physical Exam: Vitals:   06/18/17 0145 06/18/17 0200 06/18/17 0230 06/18/17 0245  BP: (!) 119/58 (!) 129/56 118/74 108/83  Pulse: 73 76 72 76  Resp:      Temp:      TempSrc:      SpO2: (!) 88% 96% 93% 97%  Weight:      Height:       General: Not in acute distress HEENT:       Eyes: PERRL, EOMI, no scleral icterus.       ENT: No discharge from the ears and nose, no pharynx injection, no tonsillar enlargement.        Neck: No JVD, no bruit, no mass felt. Heme: No neck lymph node enlargement. Cardiac: S1/S2, RRR, No murmurs, No gallops or rubs. Respiratory: No rales, wheezing, rhonchi or rubs. GI: Soft, distended, mild diffused tenderness, no rebound pain, no organomegaly, BS present. GU: No hematuria Ext: No pitting leg edema bilaterally. 2+DP/PT pulse bilaterally. Musculoskeletal: No joint deformities, No joint redness or warmth, no limitation of ROM in spin. Skin: No rashes.  Neuro: Alert, oriented X3, cranial nerves II-XII grossly intact, moves all extremities normally.  Psych: Patient is not psychotic, no suicidal or hemocidal ideation.  Labs on Admission: I have personally reviewed following labs and imaging studies  CBC: Recent Labs  Lab 06/17/17 2050  WBC 12.4*  HGB 14.7  HCT 41.7  MCV 91.9  PLT 875   Basic Metabolic Panel: Recent Labs  Lab 06/17/17 2050  NA 132*  K 3.6  CL 96*  CO2 22  GLUCOSE 123*  BUN 10  CREATININE 1.04  CALCIUM 9.2   GFR: Estimated Creatinine  Clearance: 69.5 mL/min (by C-G formula based on SCr of 1.04 mg/dL). Liver Function Tests: Recent Labs  Lab 06/17/17 2050  AST 44*  ALT 64*  ALKPHOS 86  BILITOT 1.1  PROT 7.1  ALBUMIN 3.3*   Recent Labs  Lab 06/17/17 2050  LIPASE 23   No results for input(s): AMMONIA in the last 168 hours. Coagulation Profile: No results for input(s): INR, PROTIME in the last 168 hours. Cardiac Enzymes: No results for input(s): CKTOTAL, CKMB, CKMBINDEX, TROPONINI in the last 168 hours. BNP (last 3 results) No results for input(s): PROBNP in the last 8760 hours. HbA1C: No results for input(s): HGBA1C in the last 72 hours. CBG: No results for input(s): GLUCAP in the last 168 hours. Lipid Profile: No results for input(s): CHOL, HDL, LDLCALC, TRIG, CHOLHDL, LDLDIRECT in the last 72 hours. Thyroid Function Tests: No results for input(s): TSH, T4TOTAL, FREET4, T3FREE, THYROIDAB  in the last 72 hours. Anemia Panel: No results for input(s): VITAMINB12, FOLATE, FERRITIN, TIBC, IRON, RETICCTPCT in the last 72 hours. Urine analysis:    Component Value Date/Time   COLORURINE AMBER (A) 06/17/2017 2040   APPEARANCEUR HAZY (A) 06/17/2017 2040   LABSPEC 1.019 06/17/2017 2040   PHURINE 5.0 06/17/2017 2040   GLUCOSEU NEGATIVE 06/17/2017 2040   HGBUR MODERATE (A) 06/17/2017 2040   BILIRUBINUR NEGATIVE 06/17/2017 2040   BILIRUBINUR neg 02/13/2013 1550   KETONESUR 20 (A) 06/17/2017 2040   PROTEINUR 100 (A) 06/17/2017 2040   UROBILINOGEN 0.2 02/13/2013 1550   NITRITE NEGATIVE 06/17/2017 2040   LEUKOCYTESUR NEGATIVE 06/17/2017 2040   Sepsis Labs: @LABRCNTIP (procalcitonin:4,lacticidven:4) )No results found for this or any previous visit (from the past 240 hour(s)).   Radiological Exams on Admission: Dg Chest 2 View  Result Date: 06/17/2017 CLINICAL DATA:  Dry cough, dyspnea and fever x4 days. EXAM: CHEST - 2 VIEW COMPARISON:  None. FINDINGS: Heart is top-normal in size. No aortic aneurysm is noted.  Patchy alveolar opacities are seen in the left upper lobe and lingula consistent with pneumonia are identified. Right lung is clear. No acute osseous abnormality is seen. High-riding right humeral head may reflect a chronic right rotator cuff tear. IMPRESSION: Patchy airspace opacities in the left upper lobe and lingula consistent with pneumonia. Electronically Signed   By: Ashley Royalty M.D.   On: 06/17/2017 22:36   Ct Abdomen Pelvis W Contrast  Result Date: 06/18/2017 CLINICAL DATA:  Abdominal pain and coughing EXAM: CT ABDOMEN AND PELVIS WITH CONTRAST TECHNIQUE: Multidetector CT imaging of the abdomen and pelvis was performed using the standard protocol following bolus administration of intravenous contrast. CONTRAST:  156mL ISOVUE-300 IOPAMIDOL (ISOVUE-300) INJECTION 61% COMPARISON:  None. FINDINGS: Lower chest: Lung bases show no acute consolidation or pleural effusion. Partially calcified 12 mm lung nodule posterior right lung base, possible large granuloma or hamartoma. Normal heart size. Hepatobiliary: No focal liver abnormality is seen. No gallstones, gallbladder wall thickening, or biliary dilatation. Pancreas: Unremarkable. No pancreatic ductal dilatation or surrounding inflammatory changes. Spleen: Normal in size without focal abnormality. Adrenals/Urinary Tract: Adrenal glands are within normal limits. Subcentimeter hypodensities within the kidneys, too small to further characterize. Small cysts in the lower right kidney and the upper left kidney. Bladder within normal limits. Stomach/Bowel: Stomach is nonenlarged. Few mildly enlarged loops of small bowel within the mid anterior abdomen but without convincing evidence for bowel obstruction. No colon wall thickening. Normal appendix. Vascular/Lymphatic: Mild aortic atherosclerosis. No aneurysmal dilatation. No significantly enlarged lymph nodes Reproductive: Prostate is unremarkable. Other: Negative for free air or free fluid. Fatty umbilical and  periumbilical small hernia. Musculoskeletal: Mild to moderate superior endplate deformity at L2, likely chronic. Trace retrolisthesis of L5 on S1. IMPRESSION: 1. Few fluid-filled slightly enlarged loops of small bowel within the anterior abdomen but without convincing small bowel obstruction at this time. 2. 12 mm calcified lung nodule at the right base which may reflect a granuloma or hamartoma. 3. Fat containing umbilical and periumbilical hernia Electronically Signed   By: Donavan Foil M.D.   On: 06/18/2017 00:35     EKG: Independently reviewed.  Not done in ED, will get one.   Assessment/Plan Principal Problem:   Lobar pneumonia (HCC) Active Problems:   Essential hypertension   Acid reflux   Sepsis (HCC)   Abdominal distension   Acute respiratory failure with hypoxia (HCC)   Lung nodule   Acute respiratory failure with hypoxia due to lobar pneumonia  and sepsis: pt meets criteria for sepsis with leukocytosis, tachycardia and fever. Lactic acid is normal. Hemodynamically stable.   - Will place on telemetry bed for obs.  - IV Rocephin and azithromycin - Mucinex for cough  - prn Albuterol Nebs prn for SOB - Urine legionella and S. pneumococcal antigen - Follow up blood culture x2, sputum culture - will get Procalcitonin and trend lactic acid level per sepsis protocol - IVF: 2.5 L of NS bolus in ED, followed by 75 mL per hour of NS  HTN:  -home home medications due to sepsis and risk of developing hypotension (lisinopril, chlorthalidone and amlodipine) -IV hydralazine prn  GERD: -Protonix  Abdominal distension: CT-abd/pelvis showed few fluid-filled slightly enlarged loops of small bowel within the anterior abdomen,  but without convincing small bowel obstruction at this time. Likely due to ileus.  -will try mirralax  -prn zofran  Lung nodule: showed CT scan incidentally a 12 mm calcified lung nodule at the right base which may reflect a granuloma or hamartoma. -f/u with  PCP  DVT ppx: SQ Lovenox Code Status: Full code Family Communication: None at bed side.   Disposition Plan:  Anticipate discharge back to previous home environment Consults called:  none Admission status: Obs / tele    Date of Service 06/18/2017    Ivor Costa [facility] Pager [phone]  If 7PM-7AM, please contact night-coverage [email or web address] Password [facility] 06/18/2017, 3:25 AM

## 2017-06-18 NOTE — Progress Notes (Signed)
Nutrition Brief Note  Patient identified on the Malnutrition Screening Tool (MST) Report.  Wt Readings from Last 15 Encounters:  06/17/17 206 lb (93.4 kg)  02/11/16 219 lb 12.8 oz (99.7 kg)  04/06/15 215 lb (97.5 kg)  02/15/15 211 lb (95.7 kg)  10/29/14 207 lb (93.9 kg)  10/03/14 213 lb 12.8 oz (97 kg)  08/20/14 209 lb (94.8 kg)  07/31/14 213 lb 3 oz (96.7 kg)  01/16/14 217 lb (98.4 kg)  02/13/13 218 lb 6.4 oz (99.1 kg)  09/29/12 214 lb (97.1 kg)  08/19/11 199 lb (90.3 kg)   Body mass index is 34.28 kg/m. Patient meets criteria for Obesity Class I based on current BMI.   Current diet order is Full Liquids, pt is consuming approximately 100% of meals at this time. He is eager to receive solid food.  Nutrition focused physical exam completed.  No muscle or subcutaneous fat depletion noticed.  Labs and medications reviewed. No nutrition interventions warranted at this time.  If nutrition issues arise, please consult RD.   Arthur Holms, RD, LDN Pager #: [phone] After-Hours Pager #: [phone]

## 2017-06-18 NOTE — ED Notes (Signed)
Attempted report to 2W. RN will call this RN back.

## 2017-06-18 NOTE — ED Notes (Addendum)
Pt does desat at times, especially while sleeping. O2 @ 2L applied.

## 2017-06-18 NOTE — ED Notes (Signed)
Patient denies pain and is resting comfortably.  

## 2017-06-18 NOTE — Progress Notes (Signed)
   Follow Up Note  HPI: Please refer to H&P earlier this morning for details  Briefly, 72 yr old male with hx of HTN, GERD, is currently being managed for sepsis due to CAP. Pt was also noted to have abdominal distension with CT scan showing no acute findings and negative for SBO. Today, met pt in bed, reported he was hungry as he had not eaten in days as he wasn't feeling well. Reported passing gas, denies worsening abdominal distension, pain, N/V/D. Still with cough, denies chest pain, SOB.    Exam: CV: S1, S2 present Lungs: CTA Abd: Soft, mildly distended, BS + Ext: No pedal edema  Present on Admission: . Essential hypertension . Lobar pneumonia ([place]) . Sepsis ([place]) . Abdominal distension . Acid reflux . Acute respiratory failure with hypoxia ([place]) . Lung nodule  Plan: Continue present management for CAP Abd xray: neg for SBO, will advance to full liquid diet and likely soft later this pm Monitor closely

## 2017-06-18 NOTE — ED Provider Notes (Signed)
Patient sent to the ER for evaluation after visiting his primary care doctor.  Patient has been sick for several days with fever, chills, cough and congestion.  Chest x-ray shows evidence of pneumonia.  He also, however, has been experiencing abdominal discomfort, there was concern for possible bowel obstruction.  Case was signed out to me to follow-up on CT scan.  CT scan does not show any evidence of obstruction.  This is likely early ileus.  Patient has become mildly hypoxic with a new oxygen requirement, therefore was admitted for treatment of community-acquired pneumonia.   Orpah Greek, MD 06/18/17 [phone]

## 2017-06-19 ENCOUNTER — Encounter (HOSPITAL_COMMUNITY): Payer: Self-pay | Admitting: *Deleted

## 2017-06-19 DIAGNOSIS — R140 Abdominal distension (gaseous): Secondary | ICD-10-CM | POA: Diagnosis not present

## 2017-06-19 DIAGNOSIS — J181 Lobar pneumonia, unspecified organism: Secondary | ICD-10-CM | POA: Diagnosis not present

## 2017-06-19 DIAGNOSIS — I10 Essential (primary) hypertension: Secondary | ICD-10-CM | POA: Diagnosis not present

## 2017-06-19 DIAGNOSIS — A419 Sepsis, unspecified organism: Secondary | ICD-10-CM | POA: Diagnosis not present

## 2017-06-19 DIAGNOSIS — J9601 Acute respiratory failure with hypoxia: Secondary | ICD-10-CM | POA: Diagnosis not present

## 2017-06-19 DIAGNOSIS — K219 Gastro-esophageal reflux disease without esophagitis: Secondary | ICD-10-CM | POA: Diagnosis not present

## 2017-06-19 DIAGNOSIS — R911 Solitary pulmonary nodule: Secondary | ICD-10-CM | POA: Diagnosis not present

## 2017-06-19 LAB — CBC WITH DIFFERENTIAL/PLATELET
BASOS ABS: 0 10*3/uL (ref 0.0–0.1)
BASOS PCT: 0 %
Eosinophils Absolute: 0 10*3/uL (ref 0.0–0.7)
Eosinophils Relative: 0 %
HEMATOCRIT: 36.5 % — AB (ref 39.0–52.0)
Hemoglobin: 12.3 g/dL — ABNORMAL LOW (ref 13.0–17.0)
LYMPHS PCT: 14 %
Lymphs Abs: 1.2 10*3/uL (ref 0.7–4.0)
MCH: 31 pg (ref 26.0–34.0)
MCHC: 33.7 g/dL (ref 30.0–36.0)
MCV: 91.9 fL (ref 78.0–100.0)
Monocytes Absolute: 0.6 10*3/uL (ref 0.1–1.0)
Monocytes Relative: 7 %
NEUTROS ABS: 6.6 10*3/uL (ref 1.7–7.7)
Neutrophils Relative %: 79 %
PLATELETS: 302 10*3/uL (ref 150–400)
RBC: 3.97 MIL/uL — AB (ref 4.22–5.81)
RDW: 12.6 % (ref 11.5–15.5)
WBC: 8.5 10*3/uL (ref 4.0–10.5)

## 2017-06-19 LAB — BASIC METABOLIC PANEL
ANION GAP: 10 (ref 5–15)
BUN: 9 mg/dL (ref 6–20)
CALCIUM: 8 mg/dL — AB (ref 8.9–10.3)
CO2: 23 mmol/L (ref 22–32)
Chloride: 103 mmol/L (ref 101–111)
Creatinine, Ser: 0.75 mg/dL (ref 0.61–1.24)
GLUCOSE: 115 mg/dL — AB (ref 65–99)
POTASSIUM: 3.6 mmol/L (ref 3.5–5.1)
Sodium: 136 mmol/L (ref 135–145)

## 2017-06-19 LAB — LEGIONELLA PNEUMOPHILA SEROGP 1 UR AG: L. PNEUMOPHILA SEROGP 1 UR AG: NEGATIVE

## 2017-06-19 MED ORDER — AMLODIPINE BESYLATE 10 MG PO TABS
10.0000 mg | ORAL_TABLET | Freq: Every day | ORAL | Status: DC
  Administered 2017-06-19 – 2017-06-20 (×2): 10 mg via ORAL
  Filled 2017-06-19 (×2): qty 1

## 2017-06-19 NOTE — Progress Notes (Signed)
PROGRESS NOTE  John Stewart VZD:[id number] DOB: 02-13-1946 DOA: 06/17/2017 PCP: Patient, No Pcp Per  HPI/Recap of past 24 hours: 72 yr old male with hx of HTN, GERD, presents with a 3 day hx of productive cough of greenish sputum, fever/chills. Pt is currently being managed for sepsis (fever + leukocytosis) and acute hypoxic resp failure (86% on RA) due to CAP. Pt was also noted to have abdominal distension with CT scan and abd xray showing no acute findings and negative for SBO. Pt admitted for further management.  Today, pt reported feeling better, was able to tolerate soft diet, denies any chest pain, SOB, worsening cough, abdominal pain, N/V/D. Noted to have a fever spike overnight.   Assessment/Plan: Principal Problem:   Lobar pneumonia ([place]) Active Problems:   Essential hypertension   Acid reflux   Sepsis (HCC)   Abdominal distension   Acute respiratory failure with hypoxia (HCC)   Lung nodule  Sepsis due to CAP Ongoing, fever spike of 102.9 on 3/8, with resolved leukocytosis BC X 2 pending LA WNL, procalcitonin 1.12 Influenza negative Urine strep pneumo negative, legionella pending CXR: Patchy airspace opacities in the left upper lobe and lingula consistent with PNA Continue IV Rocephin + Azithromycin Tylenol prn  Acute hypoxic respiratory failure due to CAP Resolved, no longer requiring O2 PRN duonebs  Abdominal distension ??Resolving ileus Vs SBO Currently passing gas, no abd discomfort CT scan showed: Few fluid-filled slightly enlarged loops of small bowel within the anterior abdomen,  but without convincing small bowel obstruction at this time Abd xray: Negative for any acute process Advance diet, monitor closely  HTN BP stable  Start amlodipine, continue to hold home lisinopril, chlorthalidone to minimize risk for hypotension  GERD Protonix  Lung nodule Incidental finding on CT scan which showed a 12 mm calcified lung nodule at the right base which  may reflect a granuloma or hamartoma F/U with PCP   Code Status: Full   Family Communication: None at bedside  Disposition Plan: Home once no fever for 24-48 hrs   Consultants:  None  Procedures:  None   Antimicrobials:  IV Ceftriaxone  IV Azithromycin   DVT prophylaxis:  Lovenox   Objective: Vitals:   06/18/17 2117 06/18/17 2300 06/19/17 0520 06/19/17 0803  BP: (!) 161/80  129/67 (!) 159/81  Pulse: 88  77 83  Resp: 16  16 18   Temp: (!) 102.9 F (39.4 C) 99.6 F (37.6 C) 98.6 F (37 C) 98 F (36.7 C)  TempSrc: Oral Oral Oral Oral  SpO2: 92%  93% 92%  Weight:      Height:        Intake/Output Summary (Last 24 hours) at 06/19/2017 1112 Last data filed at 06/19/2017 0700 Gross per 24 hour  Intake 2607.5 ml  Output 675 ml  Net 1932.5 ml   Filed Weights   06/17/17 2027  Weight: 93.4 kg (206 lb)    Exam:   General:  NAD  Cardiovascular: S1, S2 present  Respiratory: CTA  Abdomen: Soft, mildly distended, NT, BS+  Musculoskeletal: No pedal edema bilaterally  Skin: Normal  Psychiatry: Normal mood   Data Reviewed: CBC: Recent Labs  Lab 06/17/17 2050 06/19/17 0218  WBC 12.4* 8.5  NEUTROABS  --  6.6  HGB 14.7 12.3*  HCT 41.7 36.5*  MCV 91.9 91.9  PLT 302 295   Basic Metabolic Panel: Recent Labs  Lab 06/17/17 2050 06/19/17 0218  NA 132* 136  K 3.6 3.6  CL 96*  103  CO2 22 23  GLUCOSE 123* 115*  BUN 10 9  CREATININE 1.04 0.75  CALCIUM 9.2 8.0*   GFR: Estimated Creatinine Clearance: 90.3 mL/min (by C-G formula based on SCr of 0.75 mg/dL). Liver Function Tests: Recent Labs  Lab 06/17/17 2050  AST 44*  ALT 64*  ALKPHOS 86  BILITOT 1.1  PROT 7.1  ALBUMIN 3.3*   Recent Labs  Lab 06/17/17 2050  LIPASE 23   No results for input(s): AMMONIA in the last 168 hours. Coagulation Profile: Recent Labs  Lab 06/18/17 0402  INR 1.14   Cardiac Enzymes: No results for input(s): CKTOTAL, CKMB, CKMBINDEX, TROPONINI in the last  168 hours. BNP (last 3 results) No results for input(s): PROBNP in the last 8760 hours. HbA1C: No results for input(s): HGBA1C in the last 72 hours. CBG: No results for input(s): GLUCAP in the last 168 hours. Lipid Profile: No results for input(s): CHOL, HDL, LDLCALC, TRIG, CHOLHDL, LDLDIRECT in the last 72 hours. Thyroid Function Tests: No results for input(s): TSH, T4TOTAL, FREET4, T3FREE, THYROIDAB in the last 72 hours. Anemia Panel: No results for input(s): VITAMINB12, FOLATE, FERRITIN, TIBC, IRON, RETICCTPCT in the last 72 hours. Urine analysis:    Component Value Date/Time   COLORURINE AMBER (A) 06/17/2017 2040   APPEARANCEUR HAZY (A) 06/17/2017 2040   LABSPEC 1.019 06/17/2017 2040   PHURINE 5.0 06/17/2017 2040   GLUCOSEU NEGATIVE 06/17/2017 2040   HGBUR MODERATE (A) 06/17/2017 2040   BILIRUBINUR NEGATIVE 06/17/2017 2040   BILIRUBINUR neg 02/13/2013 1550   KETONESUR 20 (A) 06/17/2017 2040   PROTEINUR 100 (A) 06/17/2017 2040   UROBILINOGEN 0.2 02/13/2013 1550   NITRITE NEGATIVE 06/17/2017 2040   LEUKOCYTESUR NEGATIVE 06/17/2017 2040   Sepsis Labs: @LABRCNTIP (procalcitonin:4,lacticidven:4)  )No results found for this or any previous visit (from the past 240 hour(s)).    Studies: No results found.  Scheduled Meds: . aspirin  81 mg Oral Daily  . enoxaparin (LOVENOX) injection  40 mg Subcutaneous Q24H  . fluticasone  1 spray Each Nare Daily  . polyethylene glycol  17 g Oral Daily    Continuous Infusions: . sodium chloride 75 mL/hr at 06/18/17 2038  . azithromycin Stopped (06/19/17 0230)  . cefTRIAXone (ROCEPHIN)  IV Stopped (06/19/17 0016)     LOS: 0 days     Alma Friendly, MD [facility]   If 7PM-7AM, please contact night-coverage [email or web address] Password [email or web address] 06/19/2017, 11:12 AM

## 2017-06-20 ENCOUNTER — Encounter (HOSPITAL_COMMUNITY): Payer: Self-pay | Admitting: *Deleted

## 2017-06-20 DIAGNOSIS — R911 Solitary pulmonary nodule: Secondary | ICD-10-CM | POA: Diagnosis not present

## 2017-06-20 DIAGNOSIS — J189 Pneumonia, unspecified organism: Secondary | ICD-10-CM

## 2017-06-20 DIAGNOSIS — I10 Essential (primary) hypertension: Secondary | ICD-10-CM | POA: Diagnosis not present

## 2017-06-20 DIAGNOSIS — R509 Fever, unspecified: Secondary | ICD-10-CM | POA: Diagnosis present

## 2017-06-20 DIAGNOSIS — Z7982 Long term (current) use of aspirin: Secondary | ICD-10-CM | POA: Diagnosis not present

## 2017-06-20 DIAGNOSIS — R140 Abdominal distension (gaseous): Secondary | ICD-10-CM | POA: Diagnosis not present

## 2017-06-20 DIAGNOSIS — A419 Sepsis, unspecified organism: Secondary | ICD-10-CM | POA: Diagnosis not present

## 2017-06-20 DIAGNOSIS — K219 Gastro-esophageal reflux disease without esophagitis: Secondary | ICD-10-CM | POA: Diagnosis not present

## 2017-06-20 DIAGNOSIS — K567 Ileus, unspecified: Secondary | ICD-10-CM | POA: Diagnosis not present

## 2017-06-20 DIAGNOSIS — Z7951 Long term (current) use of inhaled steroids: Secondary | ICD-10-CM | POA: Diagnosis not present

## 2017-06-20 DIAGNOSIS — J9601 Acute respiratory failure with hypoxia: Secondary | ICD-10-CM | POA: Diagnosis not present

## 2017-06-20 DIAGNOSIS — J181 Lobar pneumonia, unspecified organism: Secondary | ICD-10-CM | POA: Diagnosis not present

## 2017-06-20 DIAGNOSIS — Z79899 Other long term (current) drug therapy: Secondary | ICD-10-CM | POA: Diagnosis not present

## 2017-06-20 DIAGNOSIS — K429 Umbilical hernia without obstruction or gangrene: Secondary | ICD-10-CM | POA: Diagnosis not present

## 2017-06-20 HISTORY — DX: Pneumonia, unspecified organism: J18.9

## 2017-06-20 LAB — CBC WITH DIFFERENTIAL/PLATELET
BASOS ABS: 0 10*3/uL (ref 0.0–0.1)
BASOS PCT: 0 %
Eosinophils Absolute: 0.1 10*3/uL (ref 0.0–0.7)
Eosinophils Relative: 2 %
HEMATOCRIT: 38.3 % — AB (ref 39.0–52.0)
Hemoglobin: 13.2 g/dL (ref 13.0–17.0)
LYMPHS PCT: 25 %
Lymphs Abs: 1.6 10*3/uL (ref 0.7–4.0)
MCH: 31.8 pg (ref 26.0–34.0)
MCHC: 34.5 g/dL (ref 30.0–36.0)
MCV: 92.3 fL (ref 78.0–100.0)
Monocytes Absolute: 0.4 10*3/uL (ref 0.1–1.0)
Monocytes Relative: 6 %
NEUTROS ABS: 4.4 10*3/uL (ref 1.7–7.7)
NEUTROS PCT: 67 %
PLATELETS: 348 10*3/uL (ref 150–400)
RBC: 4.15 MIL/uL — AB (ref 4.22–5.81)
RDW: 12.8 % (ref 11.5–15.5)
WBC: 6.6 10*3/uL (ref 4.0–10.5)

## 2017-06-20 LAB — BASIC METABOLIC PANEL
ANION GAP: 9 (ref 5–15)
BUN: 8 mg/dL (ref 6–20)
CO2: 24 mmol/L (ref 22–32)
Calcium: 8.1 mg/dL — ABNORMAL LOW (ref 8.9–10.3)
Chloride: 104 mmol/L (ref 101–111)
Creatinine, Ser: 0.73 mg/dL (ref 0.61–1.24)
Glucose, Bld: 105 mg/dL — ABNORMAL HIGH (ref 65–99)
POTASSIUM: 3.2 mmol/L — AB (ref 3.5–5.1)
Sodium: 137 mmol/L (ref 135–145)

## 2017-06-20 MED ORDER — DM-GUAIFENESIN ER 30-600 MG PO TB12: 1.0000 | ORAL_TABLET | Freq: Two times a day (BID) | ORAL | 0 refills | Status: DC | PRN

## 2017-06-20 MED ORDER — POTASSIUM CHLORIDE CRYS ER 20 MEQ PO TBCR
40.0000 meq | EXTENDED_RELEASE_TABLET | Freq: Once | ORAL | Status: AC
  Administered 2017-06-20: 40 meq via ORAL
  Filled 2017-06-20: qty 2

## 2017-06-20 MED ORDER — AMOXICILLIN-POT CLAVULANATE 875-125 MG PO TABS: 1.0000 | ORAL_TABLET | Freq: Two times a day (BID) | ORAL | 0 refills | Status: AC

## 2017-06-20 NOTE — Progress Notes (Signed)
Pt ambulated in hallway of unit walking full length of hallway without SOB. Before ambulating the hallway pt O2 saturation level was 98% on room air. Pt O2 saturation was 96% after walking first stretch of hallway. When pt returned to room O2 saturation was 90% and after sitting down pt was back up to 98% on room air.

## 2017-06-20 NOTE — Progress Notes (Signed)
Pt informed and agreed to discharge. Pt alert and oriented X4, vital signs stable and O2 saturation 98% on room air. Went over discharge instructions with pt and asked if pt had any questions or concerns. Pt stated he had no questions or concerns and signed AVS. This RN removed pt's IV and pt was wheeled off unit by nurse tech with clothing and cell phone to be picked up by daughter.

## 2017-06-20 NOTE — Discharge Summary (Signed)
Discharge Summary  John Stewart NAT:[id number] DOB: 06/17/1945  PCP: Patient, No Pcp Per  Admit date: 06/17/2017 Discharge date: 06/20/2017  Time spent: > 63mins  Recommendations for Outpatient Follow-up:  1. PCP- Pt will set up, already had plans to set one up  Discharge Diagnoses:  Active Hospital Problems   Diagnosis Date Noted  . Lobar pneumonia ([place]) 06/18/2017  . Pneumonia 06/20/2017  . Sepsis ([place]) 06/18/2017  . Abdominal distension 06/18/2017  . Acute respiratory failure with hypoxia ([place]) 06/18/2017  . Lung nodule 06/18/2017  . Acid reflux   . Essential hypertension 02/11/2016    Resolved Hospital Problems  No resolved problems to display.    Discharge Condition: Stable   Diet recommendation: Heart healthy  Vitals:   06/20/17 0514 06/20/17 0757  BP: (!) 142/81 (!) 145/71  Pulse: 73 85  Resp: 18 18  Temp: 98.2 F (36.8 C) 98 F (36.7 C)  SpO2: 94% 95%    History of present illness:  72 yr old male with hx of HTN, GERD, presents with a 3 day hx of productive cough of greenish sputum, fever/chills. Pt is currently being managed for sepsis (fever + leukocytosis) and acute hypoxic resp failure (86% on RA) due to CAP. Pt was also noted to have abdominal distension with CT scan and abd xray showing no acute findings and negative for SBO. Pt admitted for further management.  Today, pt reported feeling better, tolerating regular diet. Able to ambulate in the hallway with no SOB or desaturations. Denies any chest pain, SOB, worsening cough, abdominal pain, N/V/D/C   Hospital Course:  Principal Problem:   Lobar pneumonia ([place]) Active Problems:   Essential hypertension   Acid reflux   Sepsis ([place])   Abdominal distension   Acute respiratory failure with hypoxia ([facility])   Lung nodule   Pneumonia  Sepsis due to CAP Improved Afebrile >24H, with resolved leukocytosis BC X 2 NGTD LA WNL, procalcitonin 1.12 Influenza negative Urine strep pneumo negative,  legionella pending CXR: Patchy airspace opacities in the left upper lobe and lingula consistent with PNA S/P IV Rocephin + Azithromycin, will discharge pt on PO Augmentin for a total of 7 days if IV AB F/U with PCP  Acute hypoxic respiratory failure due to CAP Resolved, no longer requiring O2  Abdominal distension likely ileus Resolved Currently passing gas, no abd discomfort CT scan showed: Few fluid-filled slightly enlarged loops of small bowel within the anterior abdomen,but without convincing small bowel obstruction at this time Abd xray: Negative for any acute process F/U with PCP  HTN BP stable  Continue home amlodipine, lisinopril, chlorthalidone  GERD Protonix  Lung nodule Incidental finding on CT scan which showed a12 mm calcified lung nodule at the right base which may reflect a granuloma or hamartoma F/U with PCP     Procedures:  None  Consultations:  None  Discharge Exam: BP (!) 145/71 (BP Location: Right Arm)   Pulse 85   Temp 98 F (36.7 C) (Oral)   Resp 18   Ht 5\' 5"  (1.651 m)   Wt 93.4 kg (206 lb)   SpO2 95%   BMI 34.28 kg/m   General: NAD Cardiovascular: S1, S2 present Respiratory: CTAB  Discharge Instructions You were cared for by a hospitalist during your hospital stay. If you have any questions about your discharge medications or the care you received while you were in the hospital after you are discharged, you can call the unit and asked to speak with the hospitalist  on call if the hospitalist that took care of you is not available. Once you are discharged, your primary care physician will handle any further medical issues. Please note that NO REFILLS for any discharge medications will be authorized once you are discharged, as it is imperative that you return to your primary care physician (or establish a relationship with a primary care physician if you do not have one) for your aftercare needs so that they can reassess your need for  medications and monitor your lab values.   Allergies as of 06/20/2017   No Known Allergies     Medication List    STOP taking these medications   meclizine 25 MG tablet Commonly known as:  ANTIVERT   meloxicam 7.5 MG tablet Commonly known as:  MOBIC   omeprazole 20 MG capsule Commonly known as:  PRILOSEC   traMADol 50 MG tablet Commonly known as:  ULTRAM   zoster vaccine live (PF) 19400 UNT/0.65ML injection Commonly known as:  ZOSTAVAX     TAKE these medications   amLODipine 10 MG tablet Commonly known as:  NORVASC Take 1 tablet (10 mg total) by mouth daily.   amoxicillin-clavulanate 875-125 MG tablet Commonly known as:  AUGMENTIN Take 1 tablet by mouth 2 (two) times daily for 5 days.   aspirin 81 MG tablet Take 81 mg by mouth daily.   chlorthalidone 25 MG tablet Commonly known as:  HYGROTON Take 1 tablet (25 mg total) by mouth daily.   dextromethorphan-guaiFENesin 30-600 MG 12hr tablet Commonly known as:  MUCINEX DM Take 1 tablet by mouth 2 (two) times daily as needed for cough.   fluticasone 50 MCG/ACT nasal spray Commonly known as:  FLONASE Place 1 spray into both nostrils daily.   lisinopril 40 MG tablet Commonly known as:  PRINIVIL,ZESTRIL Take 1 tablet (40 mg total) by mouth daily.      No Known Allergies    The results of significant diagnostics from this hospitalization (including imaging, microbiology, ancillary and laboratory) are listed below for reference.    Significant Diagnostic Studies: Dg Chest 2 View  Result Date: 06/17/2017 CLINICAL DATA:  Dry cough, dyspnea and fever x4 days. EXAM: CHEST - 2 VIEW COMPARISON:  None. FINDINGS: Heart is top-normal in size. No aortic aneurysm is noted. Patchy alveolar opacities are seen in the left upper lobe and lingula consistent with pneumonia are identified. Right lung is clear. No acute osseous abnormality is seen. High-riding right humeral head may reflect a chronic right rotator cuff tear.  IMPRESSION: Patchy airspace opacities in the left upper lobe and lingula consistent with pneumonia. Electronically Signed   By: Ashley Royalty M.D.   On: 06/17/2017 22:36   Ct Abdomen Pelvis W Contrast  Result Date: 06/18/2017 CLINICAL DATA:  Abdominal pain and coughing EXAM: CT ABDOMEN AND PELVIS WITH CONTRAST TECHNIQUE: Multidetector CT imaging of the abdomen and pelvis was performed using the standard protocol following bolus administration of intravenous contrast. CONTRAST:  172mL ISOVUE-300 IOPAMIDOL (ISOVUE-300) INJECTION 61% COMPARISON:  None. FINDINGS: Lower chest: Lung bases show no acute consolidation or pleural effusion. Partially calcified 12 mm lung nodule posterior right lung base, possible large granuloma or hamartoma. Normal heart size. Hepatobiliary: No focal liver abnormality is seen. No gallstones, gallbladder wall thickening, or biliary dilatation. Pancreas: Unremarkable. No pancreatic ductal dilatation or surrounding inflammatory changes. Spleen: Normal in size without focal abnormality. Adrenals/Urinary Tract: Adrenal glands are within normal limits. Subcentimeter hypodensities within the kidneys, too small to further characterize. Small cysts in the  lower right kidney and the upper left kidney. Bladder within normal limits. Stomach/Bowel: Stomach is nonenlarged. Few mildly enlarged loops of small bowel within the mid anterior abdomen but without convincing evidence for bowel obstruction. No colon wall thickening. Normal appendix. Vascular/Lymphatic: Mild aortic atherosclerosis. No aneurysmal dilatation. No significantly enlarged lymph nodes Reproductive: Prostate is unremarkable. Other: Negative for free air or free fluid. Fatty umbilical and periumbilical small hernia. Musculoskeletal: Mild to moderate superior endplate deformity at L2, likely chronic. Trace retrolisthesis of L5 on S1. IMPRESSION: 1. Few fluid-filled slightly enlarged loops of small bowel within the anterior abdomen but  without convincing small bowel obstruction at this time. 2. 12 mm calcified lung nodule at the right base which may reflect a granuloma or hamartoma. 3. Fat containing umbilical and periumbilical hernia Electronically Signed   By: Donavan Foil M.D.   On: 06/18/2017 00:35   Dg Abd Portable 2v  Result Date: 06/18/2017 CLINICAL DATA:  Abdominal pain EXAM: PORTABLE ABDOMEN - 2 VIEW COMPARISON:  06/17/2017 FINDINGS: Scattered large and small bowel gas is noted. No obstructive changes are seen. No free air is seen. No acute bony abnormality is noted. Degenerative changes of the lumbar spine are seen. No abnormal calcifications are noted. IMPRESSION: No acute abnormality seen. Electronically Signed   By: Inez Catalina M.D.   On: 06/18/2017 10:05    Microbiology: Recent Results (from the past 240 hour(s))  Culture, blood (routine x 2) Call MD if unable to obtain prior to antibiotics being given     Status: None (Preliminary result)   Collection Time: 06/18/17  3:50 AM  Result Value Ref Range Status   Specimen Description BLOOD RIGHT ARM  Final   Special Requests   Final    BOTTLES DRAWN AEROBIC AND ANAEROBIC Blood Culture adequate volume   Culture   Final    NO GROWTH 1 DAY Performed at [facility], [address], [place], [place] [address]    Report Status PENDING  Incomplete  Culture, blood (routine x 2) Call MD if unable to obtain prior to antibiotics being given     Status: None (Preliminary result)   Collection Time: 06/18/17  4:04 AM  Result Value Ref Range Status   Specimen Description BLOOD RIGHT HAND  Final   Special Requests IN PEDIATRIC BOTTLE Blood Culture adequate volume  Final   Culture   Final    NO GROWTH 1 DAY Performed at [facility], [place] [address], [place], [place] [address]    Report Status PENDING  Incomplete     Labs: Basic Metabolic Panel: Recent Labs  Lab 06/17/17 2050 06/19/17 0218 06/20/17 0337  NA 132* 136 137  K 3.6 3.6 3.2*  CL 96* 103 104    CO2 22 23 24   GLUCOSE 123* 115* 105*  BUN 10 9 8   CREATININE 1.04 0.75 0.73  CALCIUM 9.2 8.0* 8.1*   Liver Function Tests: Recent Labs  Lab 06/17/17 2050  AST 44*  ALT 64*  ALKPHOS 86  BILITOT 1.1  PROT 7.1  ALBUMIN 3.3*   Recent Labs  Lab 06/17/17 2050  LIPASE 23   No results for input(s): AMMONIA in the last 168 hours. CBC: Recent Labs  Lab 06/17/17 2050 06/19/17 0218 06/20/17 0337  WBC 12.4* 8.5 6.6  NEUTROABS  --  6.6 4.4  HGB 14.7 12.3* 13.2  HCT 41.7 36.5* 38.3*  MCV 91.9 91.9 92.3  PLT 302 302 348   Cardiac Enzymes: No results for  input(s): CKTOTAL, CKMB, CKMBINDEX, TROPONINI in the last 168 hours. BNP: BNP (last 3 results) No results for input(s): BNP in the last 8760 hours.  ProBNP (last 3 results) No results for input(s): PROBNP in the last 8760 hours.  CBG: No results for input(s): GLUCAP in the last 168 hours.     Signed:  Alma Friendly, MD [facility] 06/20/2017, 1:42 PM

## 2017-06-23 LAB — CULTURE, BLOOD (ROUTINE X 2)
Culture: NO GROWTH
Culture: NO GROWTH
Special Requests: ADEQUATE
Special Requests: ADEQUATE

## 2017-06-25 NOTE — Consult Note (Signed)
            [facility] Primary Care Navigator  06/25/2017  John Stewart 02-Apr-1946 [id number]   Attemptto seepatient at the bedside to identify possible discharge needsbutshe was already discharged home.  Per chart review, patientwas admitted and treated for sepsis due to community acquired pneumonia (CAP). Per MD note, patient currently has no primary care provider but patient will set up and already had plans to set one up.  Patient has discharge instruction tofollow-up with primary care provider (establish a relationship with a primary care physician) for aftercare needs upon discharge.   For additional questions please contact:  Edwena Felty A. Latori Beggs, BSN, RN-BC [facility] PRIMARY CARE Navigator Cell: [phone]

## 2017-09-28 DIAGNOSIS — T63481A Toxic effect of venom of other arthropod, accidental (unintentional), initial encounter: Secondary | ICD-10-CM | POA: Diagnosis not present

## 2017-09-28 DIAGNOSIS — S60861A Insect bite (nonvenomous) of right wrist, initial encounter: Secondary | ICD-10-CM | POA: Diagnosis not present

## 2017-10-06 DIAGNOSIS — T63441A Toxic effect of venom of bees, accidental (unintentional), initial encounter: Secondary | ICD-10-CM | POA: Diagnosis not present

## 2017-10-06 DIAGNOSIS — T783XXA Angioneurotic edema, initial encounter: Secondary | ICD-10-CM | POA: Diagnosis not present

## 2018-06-07 DIAGNOSIS — H52223 Regular astigmatism, bilateral: Secondary | ICD-10-CM | POA: Diagnosis not present

## 2018-06-07 DIAGNOSIS — H524 Presbyopia: Secondary | ICD-10-CM | POA: Diagnosis not present

## 2018-06-07 DIAGNOSIS — H5203 Hypermetropia, bilateral: Secondary | ICD-10-CM | POA: Diagnosis not present

## 2018-07-07 ENCOUNTER — Telehealth: Payer: Self-pay | Admitting: *Deleted

## 2018-07-14 NOTE — Telephone Encounter (Signed)
ERROR

## 2018-07-31 ENCOUNTER — Emergency Department (HOSPITAL_COMMUNITY)
Admission: EM | Admit: 2018-07-31 | Discharge: 2018-07-31 | Disposition: A | Discharge status: Home / Self Care | Payer: Medicare HMO | Attending: Emergency Medicine | Admitting: Emergency Medicine

## 2018-07-31 ENCOUNTER — Emergency Department (HOSPITAL_COMMUNITY): Payer: Medicare HMO

## 2018-07-31 ENCOUNTER — Other Ambulatory Visit: Payer: Self-pay

## 2018-07-31 ENCOUNTER — Encounter (HOSPITAL_COMMUNITY): Payer: Self-pay | Admitting: *Deleted

## 2018-07-31 DIAGNOSIS — Y999 Unspecified external cause status: Secondary | ICD-10-CM | POA: Insufficient documentation

## 2018-07-31 DIAGNOSIS — L03116 Cellulitis of left lower limb: Secondary | ICD-10-CM | POA: Diagnosis not present

## 2018-07-31 DIAGNOSIS — I10 Essential (primary) hypertension: Secondary | ICD-10-CM | POA: Diagnosis not present

## 2018-07-31 DIAGNOSIS — Z7982 Long term (current) use of aspirin: Secondary | ICD-10-CM | POA: Diagnosis not present

## 2018-07-31 DIAGNOSIS — L03119 Cellulitis of unspecified part of limb: Secondary | ICD-10-CM

## 2018-07-31 DIAGNOSIS — Z79899 Other long term (current) drug therapy: Secondary | ICD-10-CM | POA: Insufficient documentation

## 2018-07-31 DIAGNOSIS — W450XXA Nail entering through skin, initial encounter: Secondary | ICD-10-CM | POA: Insufficient documentation

## 2018-07-31 DIAGNOSIS — Y929 Unspecified place or not applicable: Secondary | ICD-10-CM | POA: Insufficient documentation

## 2018-07-31 DIAGNOSIS — S99922A Unspecified injury of left foot, initial encounter: Secondary | ICD-10-CM | POA: Diagnosis not present

## 2018-07-31 DIAGNOSIS — Y939 Activity, unspecified: Secondary | ICD-10-CM | POA: Insufficient documentation

## 2018-07-31 DIAGNOSIS — S91332A Puncture wound without foreign body, left foot, initial encounter: Secondary | ICD-10-CM | POA: Diagnosis not present

## 2018-07-31 DIAGNOSIS — Z23 Encounter for immunization: Secondary | ICD-10-CM | POA: Insufficient documentation

## 2018-07-31 LAB — CBC
HCT: 46.2 % (ref 39.0–52.0)
Hemoglobin: 15.8 g/dL (ref 13.0–17.0)
MCH: 32.8 pg (ref 26.0–34.0)
MCHC: 34.2 g/dL (ref 30.0–36.0)
MCV: 95.9 fL (ref 80.0–100.0)
Platelets: 238 10*3/uL (ref 150–400)
RBC: 4.82 MIL/uL (ref 4.22–5.81)
RDW: 12.2 % (ref 11.5–15.5)
WBC: 6.9 10*3/uL (ref 4.0–10.5)
nRBC: 0 % (ref 0.0–0.2)

## 2018-07-31 LAB — BASIC METABOLIC PANEL
Anion gap: 12 (ref 5–15)
BUN: 12 mg/dL (ref 8–23)
CO2: 21 mmol/L — ABNORMAL LOW (ref 22–32)
Calcium: 8.8 mg/dL — ABNORMAL LOW (ref 8.9–10.3)
Chloride: 106 mmol/L (ref 98–111)
Creatinine, Ser: 0.81 mg/dL (ref 0.61–1.24)
GFR calc Af Amer: >60 mL/min (ref >60)
GFR calc non Af Amer: >60 mL/min (ref >60)
Glucose, Bld: 111 mg/dL — ABNORMAL HIGH (ref 70–99)
Potassium: 3.3 mmol/L — ABNORMAL LOW (ref 3.5–5.1)
Sodium: 139 mmol/L (ref 135–145)

## 2018-07-31 MED ORDER — CIPROFLOXACIN IN D5W 400 MG/200ML IV SOLN
400.0000 mg | Freq: Once | INTRAVENOUS | Status: AC
  Administered 2018-07-31: 400 mg via INTRAVENOUS
  Filled 2018-07-31: qty 200

## 2018-07-31 MED ORDER — CIPROFLOXACIN HCL 500 MG PO TABS: 500.0000 mg | ORAL_TABLET | Freq: Two times a day (BID) | ORAL | 0 refills | Status: DC

## 2018-07-31 MED ORDER — TETANUS-DIPHTH-ACELL PERTUSSIS 5-2.5-18.5 LF-MCG/0.5 IM SUSP
0.5000 mL | Freq: Once | INTRAMUSCULAR | Status: AC
  Administered 2018-07-31: 0.5 mL via INTRAMUSCULAR
  Filled 2018-07-31: qty 0.5

## 2018-07-31 NOTE — ED Notes (Signed)
X-ray at bedside

## 2018-07-31 NOTE — ED Triage Notes (Addendum)
Stepped on nail yesterday, nail went through shoe and into left heel area. States area is hot and painful. Unknown last tetanus

## 2018-07-31 NOTE — ED Provider Notes (Signed)
[facility] DEPT Provider Note   CSN: [id number] Arrival date & time: 07/31/18  1416    History   Chief Complaint Chief Complaint  Patient presents with   Foot Pain    HPI John Stewart is a 73 y.o. male.     HPI Patient stepped on a nail yesterday.  He went to his left heel through his shoe.  Now more pain and swelling.  States the whole foot is red.  No chills.  No fevers.  States it hurts to stand on it.  States he is otherwise healthy but also has not seen a doctor.  No drainage.  Unknown last tetanus.  No other injury. 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 Authorizing Provider  amLODipine (NORVASC) 10 MG tablet Take 1 tablet (10 mg total) by mouth daily. 65/68/12   Chitanand, Alicia B, DO  aspirin 81 MG tablet Take 81 mg by mouth daily.    [provider]  chlorthalidone (HYGROTON) 25 MG tablet Take 1 tablet (25 mg total) by mouth daily. 75/17/00   Chitanand, Alicia B, DO  ciprofloxacin (CIPRO) 500 MG tablet Take 1 tablet (500 mg total) by mouth every 12 (twelve) hours. 07/31/18   Davonna Belling, MD  dextromethorphan-guaiFENesin [facility] DM) 30-600 MG 12hr tablet Take 1 tablet by mouth 2 (two) times daily as needed for  cough. 06/20/17   Alma Friendly, MD  fluticasone (FLONASE) 50 MCG/ACT nasal spray Place 1 spray into both nostrils daily.  05/18/17   [provider]  lisinopril (PRINIVIL,ZESTRIL) 40 MG tablet Take 1 tablet (40 mg total) by mouth daily. 17/49/44   Chitanand, Deirdre Evener, DO    Family History Family History  Problem Relation Age of Onset   Cancer Father     Social History Social History   Tobacco Use   Smoking status: Never Smoker   Smokeless tobacco: Never Used  Substance Use Topics   Alcohol use: Yes    Alcohol/week: 2.0 standard drinks    Types: 2 Cans of beer per week   Drug use: No     Allergies   Patient has no known allergies.   Review of Systems Review of Systems  Constitutional: Negative for appetite change, chills and fever.  Respiratory: Negative for shortness of breath.   Cardiovascular: Negative for chest pain.  Gastrointestinal: Negative for abdominal pain.  Musculoskeletal: Negative for back pain.  Skin: Positive for wound.  Neurological: Negative for weakness.     Physical Exam Updated Vital Signs BP (!) 177/80 (BP Location: Left Arm)    Pulse 84    Temp 98.6 F (37 C) (Oral)    Resp 18    Ht 5\' 5"  (1.651 m)  Wt 99.8 kg    SpO2 99%    BMI 36.61 kg/m   Physical Exam Vitals signs and nursing note reviewed.  HENT:     Head: Atraumatic.  Neck:     Musculoskeletal: Neck supple.  Cardiovascular:     Rate and Rhythm: Normal rate.  Pulmonary:     Breath sounds: No wheezing or rhonchi.  Abdominal:     Tenderness: There is no abdominal tenderness.  Musculoskeletal:     Comments: Puncture wound to left heel.  Some erythema over foot without much swelling.  Strong dorsalis pedis pulse.  Good flexion extension at toes and ankle.  No drainage from wound.  No fluctuance.  Neurological:     General: No focal deficit present.     Mental Status: He is alert.      ED Treatments / Results  Labs (all labs ordered are listed, but only  abnormal results are displayed) Labs Reviewed  BASIC METABOLIC PANEL - Abnormal; Notable for the following components:      Result Value   Potassium 3.3 (*)    CO2 21 (*)    Glucose, Bld 111 (*)    Calcium 8.8 (*)    All other components within normal limits  CBC    EKG None  Radiology Dg Foot Complete Left  Result Date: 07/31/2018 CLINICAL DATA:  Patient states he stepped on a rusty nail while at home earlier today. EXAM: LEFT FOOT - COMPLETE 3+ VIEW COMPARISON:  None. FINDINGS: There is no evidence of fracture or dislocation. There is no evidence of arthropathy or other focal bone abnormality. Soft tissues are unremarkable. IMPRESSION: Negative. Electronically Signed   By: Kathreen Devoid   On: 07/31/2018 15:02    Procedures Procedures (including critical care time)  Medications Ordered in ED Medications  ciprofloxacin (CIPRO) IVPB 400 mg (0 mg Intravenous Stopped 07/31/18 1559)  Tdap (BOOSTRIX) injection 0.5 mL (0.5 mLs Intramuscular Given 07/31/18 1530)     Initial Impression / Assessment and Plan / ED Course  I have reviewed the triage vital signs and the nursing notes.  Pertinent labs & imaging results that were available during my care of the patient were reviewed by me and considered in my medical decision making (see chart for details).        Patient with foot pain after stepping on a nail.  Appears to have an infection.  Doubt a deep space infection at this time will need to be watched.  Labs reassuring.  IV and then oral antibiotics.  Discharge home with Ortho follow-up.  Final Clinical Impressions(s) / ED Diagnoses   Final diagnoses:  Puncture wound of left foot, initial encounter  Cellulitis of foot    ED Discharge Orders         Ordered    ciprofloxacin (CIPRO) 500 MG tablet  Every 12 hours     07/31/18 1604           Davonna Belling, MD 07/31/18 [phone]

## 2018-11-06 IMAGING — CT CT ABD-PELV W/ CM
2 of 5 series · 16 of 46 positions shown, 18 images · IV contrast (APPLIED)
Comparison: None.

CLINICAL DATA: Abdominal pain and coughing

EXAM:
CT ABDOMEN AND PELVIS WITH CONTRAST
TECHNIQUE: Multidetector CT imaging of the abdomen and pelvis was performed
using the standard protocol following bolus administration of
intravenous contrast.
CONTRAST:  100mL OZOC23-V22 IOPAMIDOL (OZOC23-V22) INJECTION 61%

[Series 3: abdomen 5.0 · axial · 0.98mm/px · z∈[+744,+1134]mm · 13 of 92 slices shown, 15 images]
[im 7/92  soft-tissue]
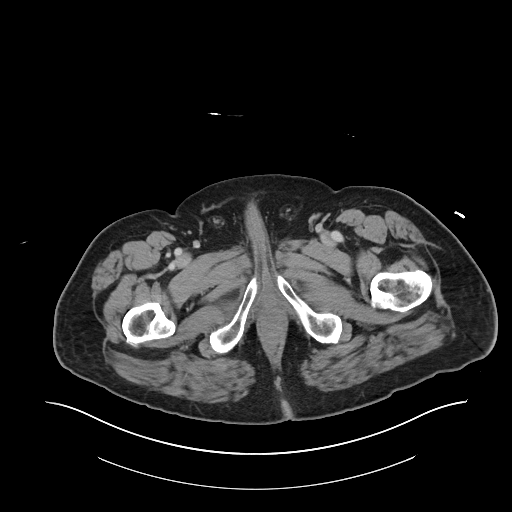
[im 7/92  bone]
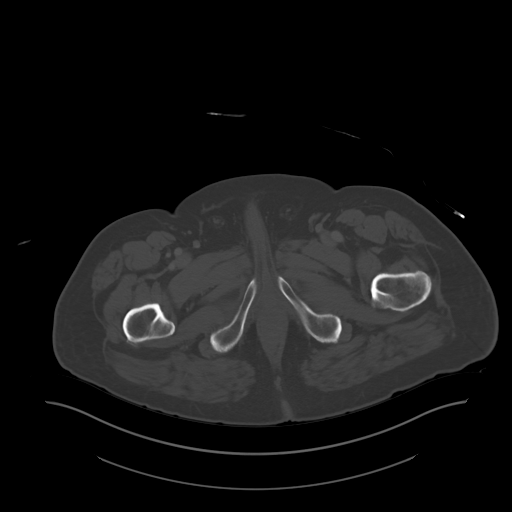
[im 13/92  soft-tissue]
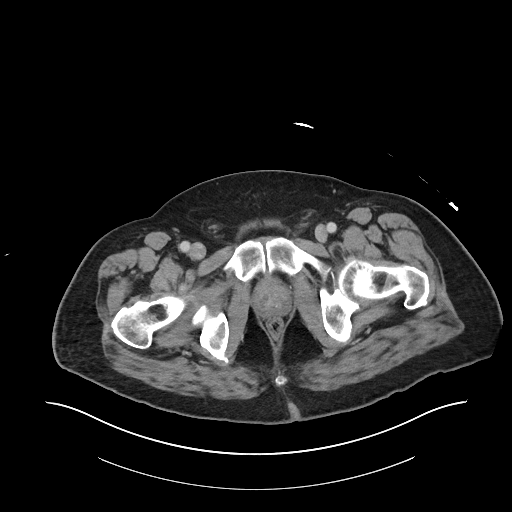
[im 19/92  soft-tissue]
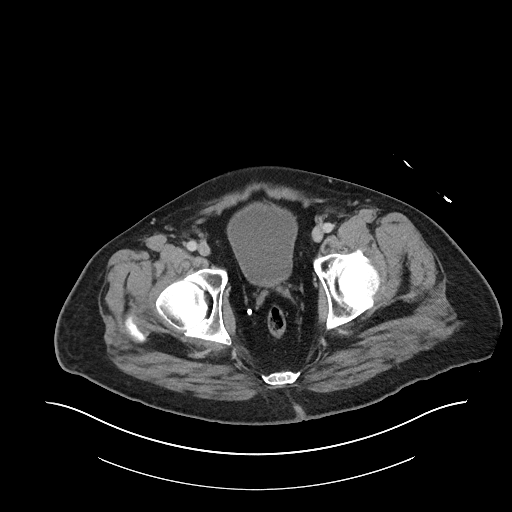
[im 25/92  soft-tissue]
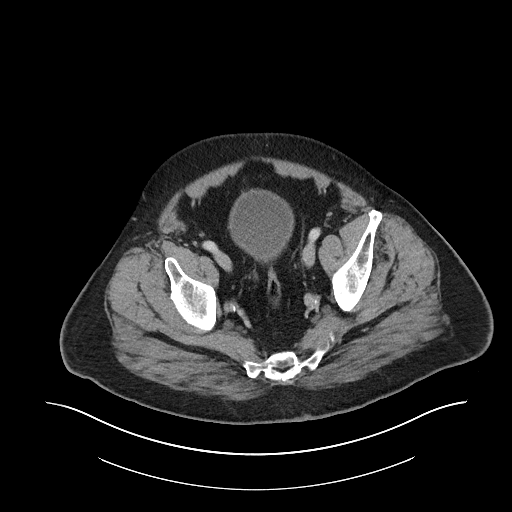
[im 31/92  soft-tissue]
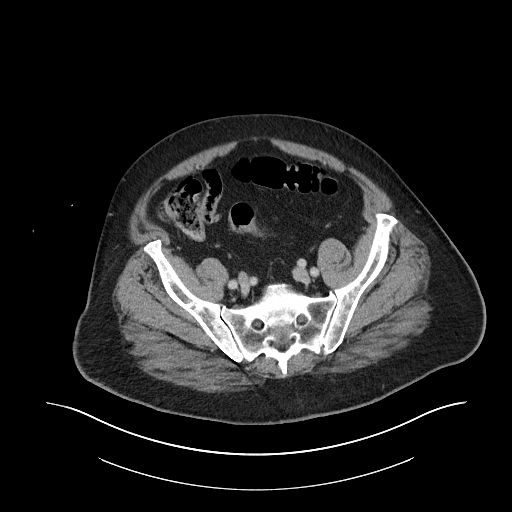
[im 37/92  soft-tissue]
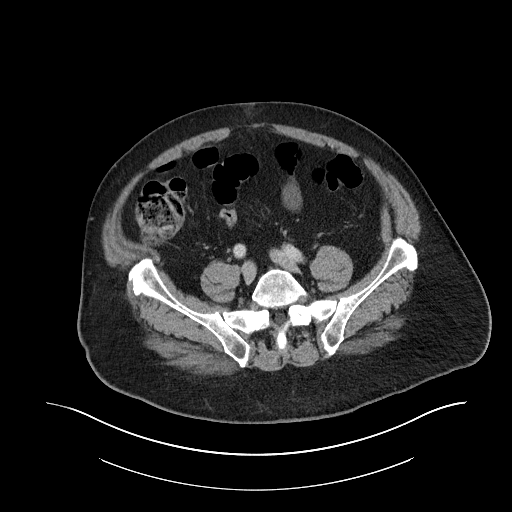
[im 49/92  soft-tissue]
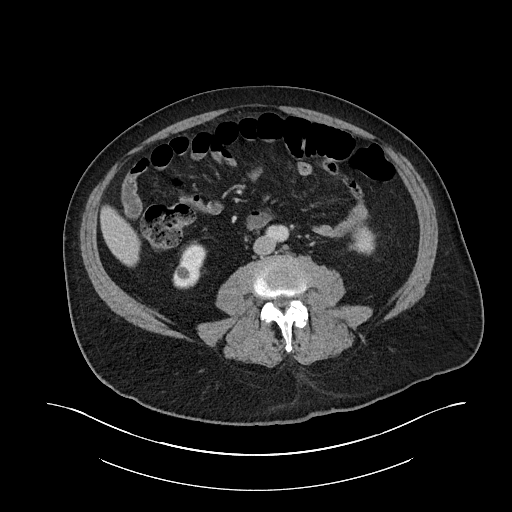
[im 55/92  soft-tissue]
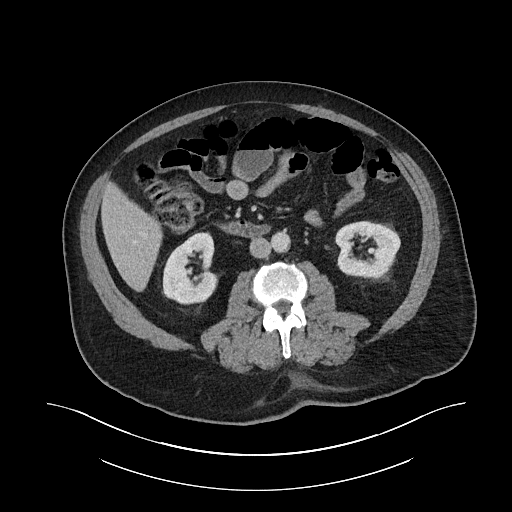
[im 61/92  soft-tissue]
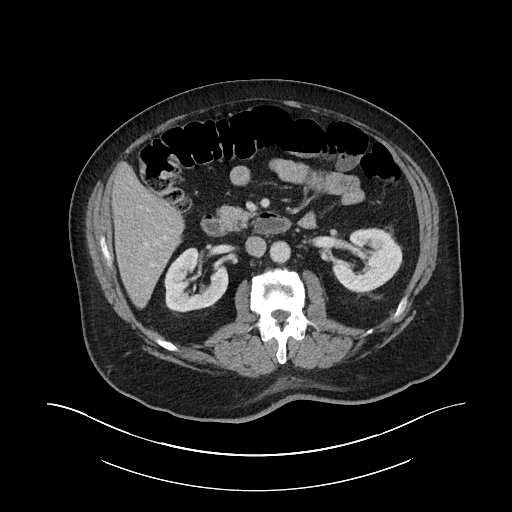
[im 61/92  bone]
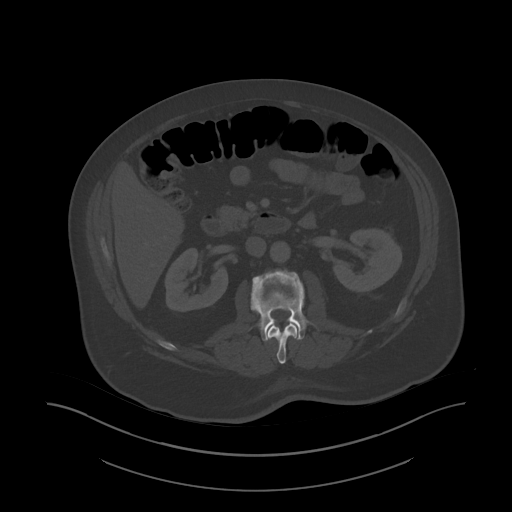
[im 67/92  soft-tissue]
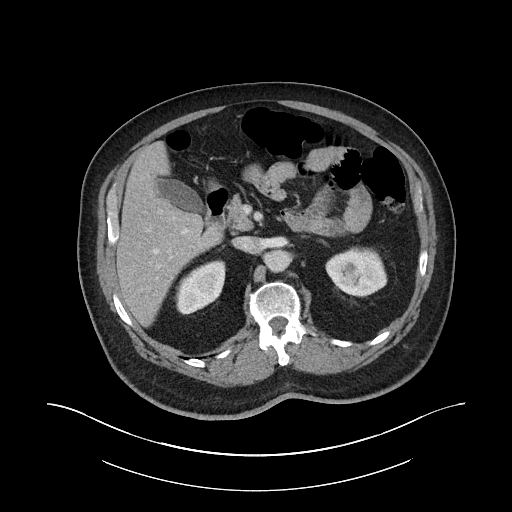
[im 73/92  soft-tissue]
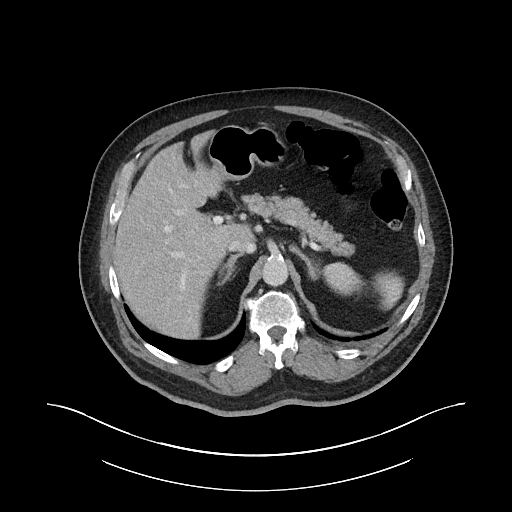
[im 79/92  soft-tissue]
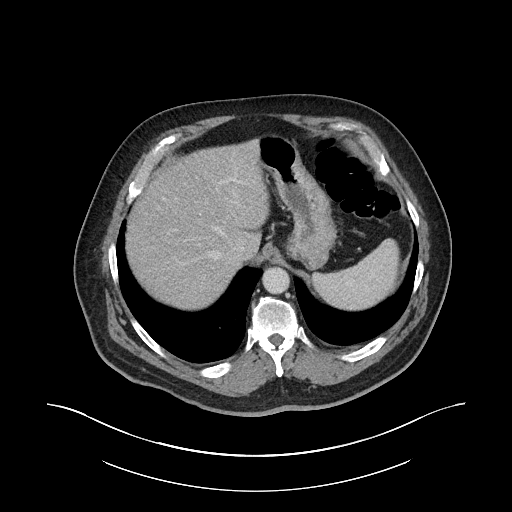
[im 85/92  soft-tissue]
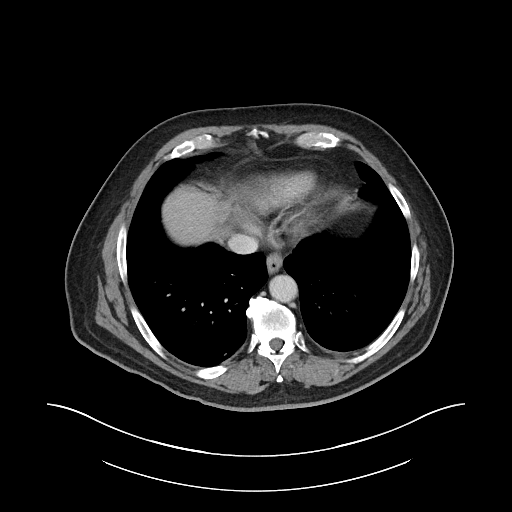

[Series 6: abdomen 3.0 mpr cor · coronal · 0.88mm/px · 3 of 101 slices shown]
[im 34/101  soft-tissue]
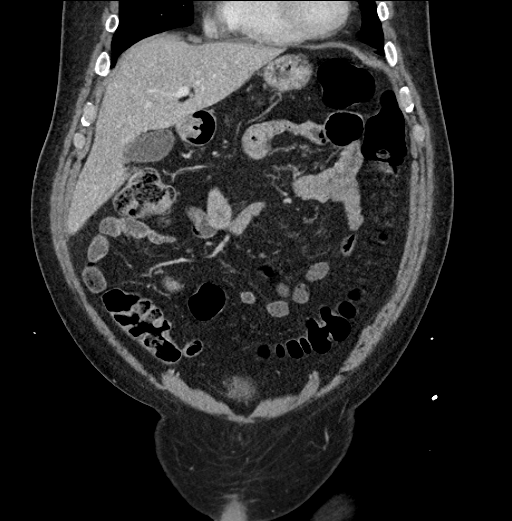
[im 45/101  soft-tissue]
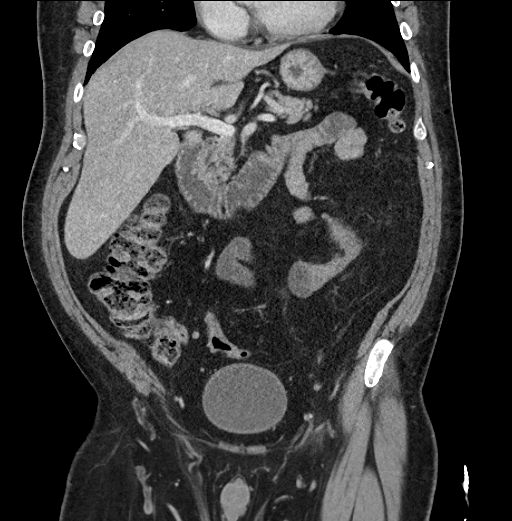
[im 56/101  soft-tissue]
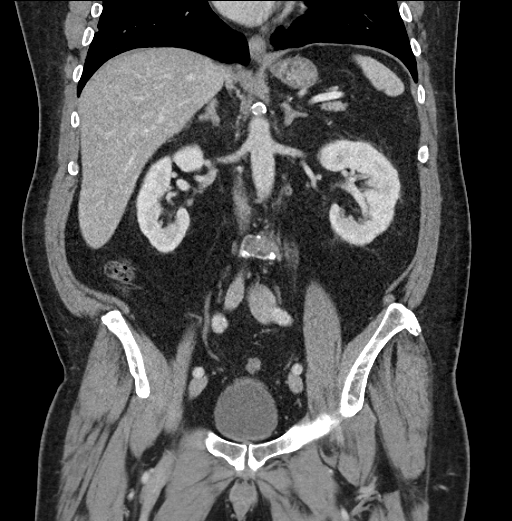

[16 of 46 positions shown; findings below may reference images not displayed]

FINDINGS: Lower chest: Lung bases show no acute consolidation or pleural
effusion. Partially calcified 12 mm lung nodule posterior right lung
base, possible large granuloma or hamartoma. Normal heart size.

Hepatobiliary: No focal liver abnormality is seen. No gallstones,
gallbladder wall thickening, or biliary dilatation.

Pancreas: Unremarkable. No pancreatic ductal dilatation or
surrounding inflammatory changes.

Spleen: Normal in size without focal abnormality.

Adrenals/Urinary Tract: Adrenal glands are within normal limits.
Subcentimeter hypodensities within the kidneys, too small to further
characterize. Small cysts in the lower right kidney and the upper
left kidney. Bladder within normal limits.

Stomach/Bowel: Stomach is nonenlarged. Few mildly enlarged loops of
small bowel within the mid anterior abdomen but without convincing
evidence for bowel obstruction. No colon wall thickening. Normal
appendix.

Vascular/Lymphatic: Mild aortic atherosclerosis. No aneurysmal
dilatation. No significantly enlarged lymph nodes

Reproductive: Prostate is unremarkable.

Other: Negative for free air or free fluid. Fatty umbilical and
periumbilical small hernia.

Musculoskeletal: Mild to moderate superior endplate deformity at L2,
likely chronic. Trace retrolisthesis of L5 on S1.
IMPRESSION: 1. Few fluid-filled slightly enlarged loops of small bowel within
the anterior abdomen but without convincing small bowel obstruction
at this time.
2. 12 mm calcified lung nodule at the right base which may reflect a
granuloma or hamartoma.
3. Fat containing umbilical and periumbilical hernia

## 2019-07-24 DIAGNOSIS — H5203 Hypermetropia, bilateral: Secondary | ICD-10-CM | POA: Diagnosis not present

## 2020-03-13 DIAGNOSIS — T24212A Burn of second degree of left thigh, initial encounter: Secondary | ICD-10-CM | POA: Diagnosis not present

## 2020-03-13 DIAGNOSIS — I10 Essential (primary) hypertension: Secondary | ICD-10-CM | POA: Diagnosis not present

## 2020-03-13 DIAGNOSIS — X102XXA Contact with fats and cooking oils, initial encounter: Secondary | ICD-10-CM | POA: Diagnosis not present

## 2020-03-15 DIAGNOSIS — R011 Cardiac murmur, unspecified: Secondary | ICD-10-CM | POA: Diagnosis not present

## 2020-03-15 DIAGNOSIS — T24212D Burn of second degree of left thigh, subsequent encounter: Secondary | ICD-10-CM | POA: Diagnosis not present

## 2020-03-15 DIAGNOSIS — I10 Essential (primary) hypertension: Secondary | ICD-10-CM | POA: Diagnosis not present

## 2020-07-04 ENCOUNTER — Other Ambulatory Visit: Payer: Self-pay

## 2020-07-04 ENCOUNTER — Encounter (HOSPITAL_BASED_OUTPATIENT_CLINIC_OR_DEPARTMENT_OTHER): Payer: Self-pay | Admitting: Nurse Practitioner

## 2020-07-04 ENCOUNTER — Ambulatory Visit (INDEPENDENT_AMBULATORY_CARE_PROVIDER_SITE_OTHER): Payer: Medicare HMO | Admitting: Nurse Practitioner

## 2020-07-04 ENCOUNTER — Other Ambulatory Visit (HOSPITAL_BASED_OUTPATIENT_CLINIC_OR_DEPARTMENT_OTHER)
Admission: RE | Admit: 2020-07-04 | Discharge: 2020-07-04 | Disposition: A | Discharge status: Home / Self Care | Payer: Medicare HMO | Source: Ambulatory Visit | Attending: Nurse Practitioner | Admitting: Nurse Practitioner

## 2020-07-04 VITALS — BP 159/87 | HR 76 | Ht 65.0 in | Wt 195.6 lb

## 2020-07-04 DIAGNOSIS — I10 Essential (primary) hypertension: Secondary | ICD-10-CM | POA: Insufficient documentation

## 2020-07-04 DIAGNOSIS — Z6832 Body mass index (BMI) 32.0-32.9, adult: Secondary | ICD-10-CM

## 2020-07-04 DIAGNOSIS — Z7689 Persons encountering health services in other specified circumstances: Secondary | ICD-10-CM

## 2020-07-04 DIAGNOSIS — J302 Other seasonal allergic rhinitis: Secondary | ICD-10-CM | POA: Diagnosis not present

## 2020-07-04 HISTORY — DX: Persons encountering health services in other specified circumstances: Z76.89

## 2020-07-04 LAB — COMPREHENSIVE METABOLIC PANEL
ALT: 16 U/L (ref 0–44)
AST: 19 U/L (ref 15–41)
Albumin: 4 g/dL (ref 3.5–5.0)
Alkaline Phosphatase: 59 U/L (ref 38–126)
Anion gap: 9 (ref 5–15)
BUN: 14 mg/dL (ref 8–23)
CO2: 24 mmol/L (ref 22–32)
Calcium: 9.2 mg/dL (ref 8.9–10.3)
Chloride: 103 mmol/L (ref 98–111)
Creatinine, Ser: 0.78 mg/dL (ref 0.61–1.24)
GFR, Estimated: >60 mL/min (ref >60)
Glucose, Bld: 97 mg/dL (ref 70–99)
Potassium: 3.8 mmol/L (ref 3.5–5.1)
Sodium: 136 mmol/L (ref 135–145)
Total Bilirubin: 0.5 mg/dL (ref 0.3–1.2)
Total Protein: 6.4 g/dL — ABNORMAL LOW (ref 6.5–8.1)

## 2020-07-04 LAB — CBC WITH DIFFERENTIAL/PLATELET
Abs Immature Granulocytes: 0.01 10*3/uL (ref 0.00–0.07)
Basophils Absolute: 0 10*3/uL (ref 0.0–0.1)
Basophils Relative: 1 %
Eosinophils Absolute: 0.1 10*3/uL (ref 0.0–0.5)
Eosinophils Relative: 1 %
HCT: 41.8 % (ref 39.0–52.0)
Hemoglobin: 14.4 g/dL (ref 13.0–17.0)
Immature Granulocytes: 0 %
Lymphocytes Relative: 21 %
Lymphs Abs: 1.1 10*3/uL (ref 0.7–4.0)
MCH: 33.6 pg (ref 26.0–34.0)
MCHC: 34.4 g/dL (ref 30.0–36.0)
MCV: 97.7 fL (ref 80.0–100.0)
Monocytes Absolute: 0.5 10*3/uL (ref 0.1–1.0)
Monocytes Relative: 8 %
Neutro Abs: 3.9 10*3/uL (ref 1.7–7.7)
Neutrophils Relative %: 69 %
Platelets: 253 10*3/uL (ref 150–400)
RBC: 4.28 MIL/uL (ref 4.22–5.81)
RDW: 12.2 % (ref 11.5–15.5)
WBC: 5.6 10*3/uL (ref 4.0–10.5)
nRBC: 0 % (ref 0.0–0.2)

## 2020-07-04 LAB — LIPID PANEL
Cholesterol: 219 mg/dL — ABNORMAL HIGH (ref 0–200)
HDL: 100 mg/dL (ref >40)
LDL Cholesterol: 103 mg/dL — ABNORMAL HIGH (ref 0–99)
Total CHOL/HDL Ratio: 2.2 RATIO
Triglycerides: 80 mg/dL (ref <150)
VLDL: 16 mg/dL (ref 0–40)

## 2020-07-04 MED ORDER — HYDROCHLOROTHIAZIDE 12.5 MG PO TABS: 12.5000 mg | ORAL_TABLET | Freq: Every day | ORAL | 1 refills | Status: DC

## 2020-07-04 MED ORDER — FLUTICASONE PROPIONATE 50 MCG/ACT NA SUSP: 2.0000 | Freq: Every day | NASAL | 12 refills | Status: DC

## 2020-07-04 MED ORDER — LISINOPRIL 40 MG PO TABS: 40.0000 mg | ORAL_TABLET | Freq: Every day | ORAL | 1 refills | Status: DC

## 2020-07-04 MED ORDER — AMLODIPINE BESYLATE 10 MG PO TABS: 10.0000 mg | ORAL_TABLET | Freq: Every day | ORAL | 1 refills | Status: DC

## 2020-07-04 NOTE — Assessment & Plan Note (Signed)
Blood pressure log from home reveals readings from 098'J-191'Y systolic with 78'G-95'A diastolic- average BP 213/08 per patient log app.  Goal BP <140/90 given patients age and health history.  Medical record reveals he is currently taking lisinopril 40mg  and amlodipine 10mg  daily.  In order to help patient reach goal, we will trial adding HCTZ to his medication regimen at 12.5mg  once a day. Monitor BP at home for the next 2 weeks and report blood pressure readings to the office. If BP at goal with current dosage, will change medication to combo dosing for easier medication management. If not, consider increasing dose to 25mg  or HCTZ to chlorthalidone for better control.  Labs ordered today for evaluation of kidney function.  Recommend 6 month follow-up for HTN management with AWV

## 2020-07-04 NOTE — Patient Instructions (Addendum)
Plan from today's visit: We will plan to start another medication for your blood pressure- this medication is called Hydrochlorothiazide (also called HCTZ). It works differently than the other two blood pressure medications.  I would like you to try this for 2 weeks and call the office or send me a MyChart message to let me know what your blood pressure readings are looking like.  If they look good, then I can send in a combination of this medication with one of the others so you don't have to take as many pills per day.  If it is still higher than 140/80, we can look to increase the dose.  I didn't want to send a big refill in just in case we need to make more changes, but will send the 90 day refills once we get the right combination of medication.   Let me know if you have any questions.  We will get labs today and I will let you know if we need to make any changes or if anything looks abnormal based on the labs.   Lets plan to schedule the medicare visit in 6 months and that way we can recheck your blood pressure at that time, too. You can schedule this when you leave.    Thank you for choosing [place] [facility] for your Primary Care needs. I am excited for the opportunity to partner with you to meet your health care goals. It was a pleasure meeting you today!  I am an Adult-Geriatric Nurse Practitioner with a background in caring for patients for more than 20 years. I received my Paediatric nurse in Nursing and my Doctor of Nursing Practice degrees at [employer]. I received additional fellowship training in primary care and sports medicine after receiving my doctorate degree. I provide primary care and sports medicine services to patients age 14 and older within this office. I am also a provider with the [facility] and the director of the APP Fellowship with [facility].  I am a [place] native, but have called the  [place] area home for nearly 20 years and am proud to be a member of this community.   I am passionate about providing the best service to you through preventive medicine and supportive care. I consider you a part of the medical team and value your input. I work diligently to ensure that you are heard and your needs are met in a safe and effective manner. I want you to feel comfortable with me as your provider and want you to know that your health concerns are important to me.   For your information, our office hours are Monday- Friday 8:00 AM - 5:00 PM At this time I am not in the office on Wednesdays.  If you have questions or concerns, please call our office at [phone] or send [place] a MyChart message and we will respond as quickly as possible.   For all urgent or time sensitive needs we ask that you please call the office to avoid delays. MyChart is not constantly monitored and replies may take up to 72 business hours.  MyChart Policy: . MyChart allows for you to see your visit notes, after visit summary, provider recommendations, lab and tests results, make an appointment, request refills, and contact your provider or the office for non-urgent questions or concerns.  . Providers are seeing patients during normal business hours and do not have built in time to review  MyChart messages. We ask that you allow a minimum of 72 business hours for MyChart message responses.  . Complex MyChart concerns may require a visit. Your provider may request you schedule a virtual or in person visit to ensure we are providing the best care possible. . MyChart messages sent after 4:00 PM on Friday will not be received by the provider until Monday morning.    Lab and Test Results: . You will receive your lab and test results on MyChart as soon as they are completed and results have been sent by the lab or testing facility. Due to this service, you will receive your results BEFORE your provider.  . Please  allow a minimum of 72 business hours for your provider to receive and review lab and test results and contact you about.   . Most lab and test result comments from the provider will be sent through [place]. Your provider may recommend changes to the plan of care, follow-up visits, repeat testing, ask questions, or request an office visit to discuss these results. You may reply directly to this message or call the office at [phone] to provide information for the provider or set up an appointment. . In some instances, you will be called with test results and recommendations. Please let us know if this is preferred and we will make note of this in your chart to provide this for you.    . If you have not heard a response to your lab or test results in 72 business hours, please call the office to let us know.   After Hours: . For all non-emergency after hours needs, please call the office at [phone] and select the option to reach the on-call provider service. On-call services are shared between multiple [place] offices and therefore it will not be possible to speak directly with your provider. On-call providers may provide medical advice and recommendations, but are unable to provide refills for maintenance medications.  . For all emergency or urgent medical needs after normal business hours, we recommend that you seek care at the closest Urgent Care or Emergency Department to ensure appropriate treatment in a timely manner.  [facility] [place] [facility] has a 24 hour emergency room located on the ground floor for your convenience.    Please do not hesitate to reach out to [place] with concerns.   Thank you, again, for choosing me as your health care partner. I appreciate your trust and look forward to learning more about you.   SaraBeth Osa Campoli, DNP, AGNP-c ____________________________________________________________________________   Hypertension, Adult Hypertension is another name  for high blood pressure. High blood pressure forces your heart to work harder to pump blood. This can cause problems over time. There are two numbers in a blood pressure reading. There is a top number (systolic) over a bottom number (diastolic). It is best to have a blood pressure that is below 120/80. Healthy choices can help lower your blood pressure, or you may need medicine to help lower it. What are the causes? The cause of this condition is not known. Some conditions may be related to high blood pressure. What increases the risk?  Smoking.  Having type 2 diabetes mellitus, high cholesterol, or both.  Not getting enough exercise or physical activity.  Being overweight.  Having too much fat, sugar, calories, or salt (sodium) in your diet.  Drinking too much alcohol.  Having long-term (chronic) kidney disease.  Having a family history of high blood pressure.  Age.  Risk increases with age.  Race. You may be at higher risk if you are African American.  Gender. Men are at higher risk than women before age 38. After age 67, women are at higher risk than men.  Having obstructive sleep apnea.  Stress. What are the signs or symptoms?  High blood pressure may not cause symptoms. Very high blood pressure (hypertensive crisis) may cause: ? Headache. ? Feelings of worry or nervousness (anxiety). ? Shortness of breath. ? Nosebleed. ? A feeling of being sick to your stomach (nausea). ? Throwing up (vomiting). ? Changes in how you see. ? Very bad chest pain. ? Seizures. How is this treated?  This condition is treated by making healthy lifestyle changes, such as: ? Eating healthy foods. ? Exercising more. ? Drinking less alcohol.  Your health care provider may prescribe medicine if lifestyle changes are not enough to get your blood pressure under control, and if: ? Your top number is above 130. ? Your bottom number is above 80.  Your personal target blood pressure may  vary. Follow these instructions at home: Eating and drinking  If told, follow the DASH eating plan. To follow this plan: ? Fill one half of your plate at each meal with fruits and vegetables. ? Fill one fourth of your plate at each meal with whole grains. Whole grains include whole-wheat pasta, brown rice, and whole-grain bread. ? Eat or drink low-fat dairy products, such as skim milk or low-fat yogurt. ? Fill one fourth of your plate at each meal with low-fat (lean) proteins. Low-fat proteins include fish, chicken without skin, eggs, beans, and tofu. ? Avoid fatty meat, cured and processed meat, or chicken with skin. ? Avoid pre-made or processed food.  Eat less than 1,500 mg of salt each day.  Do not drink alcohol if: ? Your doctor tells you not to drink. ? You are pregnant, may be pregnant, or are planning to become pregnant.  If you drink alcohol: ? Limit how much you use to:  0-1 drink a day for women.  0-2 drinks a day for men. ? Be aware of how much alcohol is in your drink. In the [place] one drink equals one 12 oz bottle of beer (355 mL), one 5 oz glass of wine (148 mL), or one 1 oz glass of hard liquor (44 mL).   Lifestyle  Work with your doctor to stay at a healthy weight or to lose weight. Ask your doctor what the best weight is for you.  Get at least 30 minutes of exercise most days of the week. This may include walking, swimming, or biking.  Get at least 30 minutes of exercise that strengthens your muscles (resistance exercise) at least 3 days a week. This may include lifting weights or doing Pilates.  Do not use any products that contain nicotine or tobacco, such as cigarettes, e-cigarettes, and chewing tobacco. If you need help quitting, ask your doctor.  Check your blood pressure at home as told by your doctor.  Keep all follow-up visits as told by your doctor. This is important.   Medicines  Take over-the-counter and prescription medicines only as told by your  doctor. Follow directions carefully.  Do not skip doses of blood pressure medicine. The medicine does not work as well if you skip doses. Skipping doses also puts you at risk for problems.  Ask your doctor about side effects or reactions to medicines that you should watch for. Contact a doctor if you:  Think you are having a reaction to the medicine you are taking.  Have headaches that keep coming back (recurring).  Feel dizzy.  Have swelling in your ankles.  Have trouble with your vision. Get help right away if you:  Get a very bad headache.  Start to feel mixed up (confused).  Feel weak or numb.  Feel faint.  Have very bad pain in your: ? Chest. ? Belly (abdomen).  Throw up more than once.  Have trouble breathing. Summary  Hypertension is another name for high blood pressure.  High blood pressure forces your heart to work harder to pump blood.  For most people, a normal blood pressure is less than 120/80.  Making healthy choices can help lower blood pressure. If your blood pressure does not get lower with healthy choices, you may need to take medicine. This information is not intended to replace advice given to you by your health care provider. Make sure you discuss any questions you have with your health care provider. Document Revised: 12/08/2017 Document Reviewed: 12/08/2017 Elsevier Patient Education  2021 [employer].

## 2020-07-04 NOTE — Progress Notes (Signed)
New Patient Office Visit  Subjective:  Patient ID: John Stewart, male    DOB: [birth year]  Age: 75 y.o. MRN: [id number]  CC:  Chief Complaint  Patient presents with  . Establish Care    HPI John Stewart is a pleasant 75 year old male presenting today to establish care with this practice.   He reports that he is married to McDonough for 36 years and they have 2 adult children who live locally and they visit with often. 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 He is active at home and performs all of his own activities of daily living including cooking and keeping the house. He tells me he feels safe in his home and his community.   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 He spends most of his time at home with Stanton Kidney and one of their daughters.   He reports that he drinks alcohol on rare occasion, usually when watching a basketball game and does not drink more than 2 drinks in one setting. He does not use nicotine containing products or illegal substances.  He is not currently sexually active and does not have any concerns with STI today.   He denies changes to his skin, bowel habits, or bladder habits.   He reports that he has not had a PCP in some time, but has generally sought care from Urgent Care at [facility] when acute needs presented. Last fall he suffered a burn to his left thigh while cooking a [place] and when he was seen for that injury he was told his blood pressure was significantly elevated. He was started on lisinopril and amlodipine at that time and it was recommended that he seek care from a PCP for further management of his hypertension. He presents today for this management.  He has been monitoring his blood pressure at home on a regular basis and reports his average blood pressure readings are in the 150's/80's. He has had readings as high as 180, which he reports  scared him. He denies any vision changes, chest pain, palpitations, weakness, lower extremity edema, dizziness, or mental status changes.   He tells me he has removed all salt from his diet to help with is blood pressure control. He does endorse a diet that is not quite as healthy as he feels it should be. He endorses activity with walking one mile every day.  He also reports some intermittent allergy symptoms in the Spring. He does not take anything for this, but is interested in options today.   He tells me that he did have one hospitalization about 3 years ago for pnuemonia and sepsis that required a 4 day hospital stay, but otherwise he has not had any significant health concerns until the hypertension.   He is unsure if he has had a colonoscopy. He does not get the flu vaccine. He has had pfizer COVID vaccines with booster.   Past Medical History:  Diagnosis Date  . Abdominal distension 06/18/2017  . Acid reflux   . Hypertension   . Lobar pneumonia ([place]) 06/18/2017  . Pneumonia 06/20/2017  . Sepsis ([place]) 06/18/2017    Past Surgical History:  Procedure Laterality Date  . biceps tendon surgery Right   . SHOULDER SURGERY      Family History  Problem Relation Age of Onset  . Cancer Father     Social  History   Socioeconomic History  . Marital status: Married    Spouse name: Not on file  . Number of children: Not on file  . Years of education: Not on file  . Highest education level: Not on file  Occupational History  . Not on file  Tobacco Use  . Smoking status: Never Smoker  . Smokeless tobacco: Never Used  Vaping Use  . Vaping Use: Never used  Substance and Sexual Activity  . Alcohol use: Yes    Alcohol/week: 2.0 standard drinks    Types: 2 Cans of beer per week  . Drug use: No  . Sexual activity: Not Currently    Birth control/protection: None  Other Topics Concern  . Not on file  Social History Narrative  . Not on file   Social Determinants of Health    Financial Resource Strain: Not on file  Food Insecurity: Not on file  Transportation Needs: No Transportation Needs  . Lack of Transportation (Medical): No  . Lack of Transportation (Non-Medical): No  Physical Activity: Insufficiently Active  . Days of Exercise per Week: 7 days  . Minutes of Exercise per Session: 20 min  Stress: Not on file  Social Connections: Moderately Isolated  . Frequency of Communication with Friends and Family: More than three times a week  . Frequency of Social Gatherings with Friends and Family: More than three times a week  . Attends Religious Services: Never  . Active Member of Clubs or Organizations: No  . Attends Archivist Meetings: Never  . Marital Status: Married  Human resources officer Violence: Not At Risk  . Fear of Current or Ex-Partner: No  . Emotionally Abused: No  . Physically Abused: No  . Sexually Abused: No    ROS Review of Systems  Constitutional: Negative for activity change, appetite change, fatigue and unexpected weight change.  HENT: Negative for congestion, ear pain and rhinorrhea.   Eyes: Negative for visual disturbance.  Respiratory: Negative for cough, chest tightness, shortness of breath and wheezing.   Cardiovascular: Negative for chest pain, palpitations and leg swelling.  Gastrointestinal: Negative for abdominal distention, abdominal pain, constipation and diarrhea.  Genitourinary: Negative for difficulty urinating.  Musculoskeletal: Negative for arthralgias.  Skin: Negative for color change, pallor, rash and wound.  Neurological: Negative for dizziness, weakness, light-headedness and headaches.  Hematological: Negative for adenopathy. Does not bruise/bleed easily.  Psychiatric/Behavioral: Negative for dysphoric mood and sleep disturbance. The patient is not nervous/anxious.     Objective:   Today's Vitals: BP (!) 159/87   Pulse 76   Ht 5\' 5"  (1.651 m)   Wt 195 lb 9.6 oz (88.7 kg)   SpO2 97%   BMI 32.55  kg/m   Physical Exam Vitals and nursing note reviewed.  Constitutional:      Appearance: Normal appearance.  HENT:     Head: Normocephalic.     Right Ear: Tympanic membrane normal.     Left Ear: A middle ear effusion is present.     Ears:     Comments: Clear effusion present in left ear corresponding to allergy concerns.     Nose: Congestion and rhinorrhea present.     Mouth/Throat:     Mouth: Mucous membranes are moist.     Pharynx: Oropharynx is clear.  Eyes:     Extraocular Movements: Extraocular movements intact.     Conjunctiva/sclera: Conjunctivae normal.     Pupils: Pupils are equal, round, and reactive to light.  Neck:  Vascular: No carotid bruit.  Cardiovascular:     Rate and Rhythm: Normal rate and regular rhythm.     Pulses: Normal pulses.     Heart sounds: Normal heart sounds. No murmur heard.   Pulmonary:     Effort: Pulmonary effort is normal.     Breath sounds: Normal breath sounds.  Abdominal:     General: Bowel sounds are normal. There is no distension.     Palpations: Abdomen is soft.     Tenderness: There is no abdominal tenderness. There is no right CVA tenderness or left CVA tenderness.  Musculoskeletal:        General: Normal range of motion.     Cervical back: Normal range of motion.     Right lower leg: No edema.     Left lower leg: No edema.  Skin:    General: Skin is warm and dry.     Capillary Refill: Capillary refill takes less than 2 seconds.  Neurological:     General: No focal deficit present.     Mental Status: He is alert and oriented to person, place, and time.  Psychiatric:        Mood and Affect: Mood normal.        Behavior: Behavior normal.        Thought Content: Thought content normal.        Judgment: Judgment normal.     Assessment & Plan:   Problem List Items Addressed This Visit      Cardiovascular and Mediastinum   Essential hypertension    Blood pressure log from home reveals readings from 629'B-284'X  systolic with 32'G-40'N diastolic- average BP 027/25 per patient log app.  Goal BP <140/90 given patients age and health history.  Medical record reveals he is currently taking lisinopril 40mg  and amlodipine 10mg  daily.  In order to help patient reach goal, we will trial adding HCTZ to his medication regimen at 12.5mg  once a day. Monitor BP at home for the next 2 weeks and report blood pressure readings to the office. If BP at goal with current dosage, will change medication to combo dosing for easier medication management. If not, consider increasing dose to 25mg  or HCTZ to chlorthalidone for better control.  Labs ordered today for evaluation of kidney function.  Recommend 6 month follow-up for HTN management with [facility]      Relevant Medications   hydrochlorothiazide (HYDRODIURIL) 12.5 MG tablet   amLODipine (NORVASC) 10 MG tablet   lisinopril (ZESTRIL) 40 MG tablet   Other Relevant Orders   CBC with Differential (Completed)   Comprehensive metabolic panel (Completed)   Lipid panel     Other   Encounter to establish care with new doctor - Primary    New patient to establish care with this practice today.  Reviewed current and past medical history, family history, medications, social determinants of health, and health maintenance.  Patient is unsure of much of his health maintenance including pneumonia vaccinations and colonoscopy screenings. Given limited primary care management over the past several years, it is likely these are due.  Consider cologuard in place of Colonoscopy if not up to date.  Will address PNA vaccination, if not up to date, at [facility] in 6 months.       Relevant Orders   CBC with Differential (Completed)   Comprehensive metabolic panel (Completed)   Lipid panel    Other Visit Diagnoses    Seasonal allergies       Relevant Medications  fluticasone (FLONASE) 50 MCG/ACT nasal spray   Other Relevant Orders   CBC with Differential (Completed)   Body mass index (BMI)  of 32.0-32.9 in adult          Outpatient Encounter Medications as of 07/04/2020  Medication Sig  . fluticasone (FLONASE) 50 MCG/ACT nasal spray Place 2 sprays into both nostrils daily.  . hydrochlorothiazide (HYDRODIURIL) 12.5 MG tablet Take 1 tablet (12.5 mg total) by mouth daily.  . [DISCONTINUED] amLODipine (NORVASC) 10 MG tablet Take 1 tablet (10 mg total) by mouth daily.  . [DISCONTINUED] aspirin 81 MG tablet Take 81 mg by mouth daily.  . [DISCONTINUED] chlorthalidone (HYGROTON) 25 MG tablet Take 1 tablet (25 mg total) by mouth daily.  . [DISCONTINUED] ciprofloxacin (CIPRO) 500 MG tablet Take 1 tablet (500 mg total) by mouth every 12 (twelve) hours.  . [DISCONTINUED] dextromethorphan-guaiFENesin (MUCINEX DM) 30-600 MG 12hr tablet Take 1 tablet by mouth 2 (two) times daily as needed for cough.  . [DISCONTINUED] fluticasone (FLONASE) 50 MCG/ACT nasal spray Place 1 spray into both nostrils daily.   . [DISCONTINUED] lisinopril (PRINIVIL,ZESTRIL) 40 MG tablet Take 1 tablet (40 mg total) by mouth daily.  Marland Kitchen amLODipine (NORVASC) 10 MG tablet Take 1 tablet (10 mg total) by mouth daily.  Marland Kitchen lisinopril (ZESTRIL) 40 MG tablet Take 1 tablet (40 mg total) by mouth daily.   No facility-administered encounter medications on file as of 07/04/2020.    Follow-up: Return in about 6 months (around 01/04/2021) for [facility] - Lab appointment for today.   Orma Render, NP

## 2020-07-04 NOTE — Assessment & Plan Note (Signed)
New patient to establish care with this practice today.  Reviewed current and past medical history, family history, medications, social determinants of health, and health maintenance.  Patient is unsure of much of his health maintenance including pneumonia vaccinations and colonoscopy screenings. Given limited primary care management over the past several years, it is likely these are due.  Consider cologuard in place of Colonoscopy if not up to date.  Will address PNA vaccination, if not up to date, at [facility] in 6 months.

## 2020-07-05 NOTE — Progress Notes (Signed)
Labs look good- overall cholesterol slight bump due to elevated HDL- LDL is just above goal. Recommend reducing saturated fats from diet.   No other changes today.

## 2020-08-29 ENCOUNTER — Other Ambulatory Visit (HOSPITAL_BASED_OUTPATIENT_CLINIC_OR_DEPARTMENT_OTHER): Payer: Self-pay | Admitting: Nurse Practitioner

## 2020-08-29 DIAGNOSIS — I10 Essential (primary) hypertension: Secondary | ICD-10-CM

## 2020-09-05 ENCOUNTER — Telehealth (HOSPITAL_BASED_OUTPATIENT_CLINIC_OR_DEPARTMENT_OTHER): Payer: Self-pay

## 2020-09-05 DIAGNOSIS — I10 Essential (primary) hypertension: Secondary | ICD-10-CM

## 2020-09-05 MED ORDER — HYDROCHLOROTHIAZIDE 12.5 MG PO TABS: 12.5000 mg | ORAL_TABLET | Freq: Every day | ORAL | 3 refills | Status: DC

## 2020-09-05 NOTE — Telephone Encounter (Signed)
Incoming fax request for Authorization of HCTZ 12.5 mg to take once daily Patient scheduled for follow up on 01/09/21 for [facility]. Will authorize refills until next appointment

## 2020-09-11 ENCOUNTER — Other Ambulatory Visit (HOSPITAL_BASED_OUTPATIENT_CLINIC_OR_DEPARTMENT_OTHER): Payer: Self-pay | Admitting: Nurse Practitioner

## 2021-01-06 NOTE — Patient Instructions (Signed)
  [place] Maintenance Summary and Written Plan of Care  [name] ,  Thank you for allowing me to perform your Medicare Annual Wellness Visit and for your ongoing commitment to your health.  Immunization History  Administered Date(s) Administered   Fluad Quad(high Dose 65+) 01/09/2021   Influenza,inj,Quad PF,6+ Mos 02/13/2013, 02/15/2015, 02/11/2016   Pfizer Covid-19 Vaccine Bivalent Booster 67yrs & up 01/08/2021   Tdap 04/15/2007, 07/31/2018   Zoster, Live 08/21/2014    These are the patient goals that we discussed:  Goals Addressed               This Visit's Progress     Weight (lb) < 200 lb (90.7 kg) (pt-stated)   188 lb 6.4 oz (85.5 kg)     Would like to get down to 175 pounds.        Orders/Referrals Placed Today: Orders Placed This Encounter  Procedures   Flu vaccine HIGH DOSE PF (Fluzone High dose)   Flu Vaccine QUAD High Dose(Fluad)     Follow-up Plan 6 months for blood pressure follow-up   John Evalyne Cortopassi, DNP, AGNP-c

## 2021-01-08 ENCOUNTER — Other Ambulatory Visit (HOSPITAL_BASED_OUTPATIENT_CLINIC_OR_DEPARTMENT_OTHER): Payer: Self-pay

## 2021-01-08 ENCOUNTER — Other Ambulatory Visit: Payer: Self-pay

## 2021-01-08 ENCOUNTER — Ambulatory Visit: Payer: Medicare HMO | Attending: Internal Medicine

## 2021-01-08 DIAGNOSIS — Z23 Encounter for immunization: Secondary | ICD-10-CM

## 2021-01-08 MED ORDER — PFIZER COVID-19 VAC BIVALENT 30 MCG/0.3ML IM SUSP
INTRAMUSCULAR | 0 refills | Status: DC
  Filled 2021-01-08: qty 0.3, 1d supply, fill #0

## 2021-01-08 NOTE — Progress Notes (Signed)
   Covid-19 Vaccination Clinic  Name:  John Stewart    MRN: [id number] DOB: November 12, 1945  01/08/2021  [name] was observed post Covid-19 immunization for 15 minutes without incident. He was provided with Vaccine Information Sheet and instruction to access the V-Safe system.   [name] was instructed to call 911 with any severe reactions post vaccine: Difficulty breathing  Swelling of face and throat  A fast heartbeat  A bad rash all over body  Dizziness and weakness

## 2021-01-09 ENCOUNTER — Encounter (HOSPITAL_BASED_OUTPATIENT_CLINIC_OR_DEPARTMENT_OTHER): Payer: Self-pay | Admitting: Nurse Practitioner

## 2021-01-09 ENCOUNTER — Other Ambulatory Visit: Payer: Self-pay

## 2021-01-09 ENCOUNTER — Ambulatory Visit (INDEPENDENT_AMBULATORY_CARE_PROVIDER_SITE_OTHER): Payer: Medicare HMO | Admitting: Nurse Practitioner

## 2021-01-09 VITALS — BP 133/72 | HR 90 | Ht 65.0 in | Wt 188.4 lb

## 2021-01-09 DIAGNOSIS — Z23 Encounter for immunization: Secondary | ICD-10-CM | POA: Diagnosis not present

## 2021-01-09 DIAGNOSIS — I10 Essential (primary) hypertension: Secondary | ICD-10-CM

## 2021-01-09 DIAGNOSIS — Z0000 Encounter for general adult medical examination without abnormal findings: Secondary | ICD-10-CM | POA: Diagnosis not present

## 2021-01-09 LAB — COMPREHENSIVE METABOLIC PANEL
ALT: 20 IU/L (ref 0–44)
AST: 29 IU/L (ref 0–40)
Albumin/Globulin Ratio: 2 (ref 1.2–2.2)
Albumin: 4.3 g/dL (ref 3.7–4.7)
Alkaline Phosphatase: 74 IU/L (ref 44–121)
BUN/Creatinine Ratio: 15 (ref 10–24)
BUN: 15 mg/dL (ref 8–27)
Bilirubin Total: 0.4 mg/dL (ref 0.0–1.2)
CO2: 25 mmol/L (ref 20–29)
Calcium: 9.9 mg/dL (ref 8.6–10.2)
Chloride: 101 mmol/L (ref 96–106)
Creatinine, Ser: 0.99 mg/dL (ref 0.76–1.27)
Globulin, Total: 2.1 g/dL (ref 1.5–4.5)
Glucose: 105 mg/dL — ABNORMAL HIGH (ref 70–99)
Potassium: 4 mmol/L (ref 3.5–5.2)
Sodium: 140 mmol/L (ref 134–144)
Total Protein: 6.4 g/dL (ref 6.0–8.5)
eGFR: 80 mL/min/{1.73_m2} (ref >59)

## 2021-01-09 LAB — CBC WITH DIFFERENTIAL
Basophils Absolute: 0 10*3/uL (ref 0.0–0.2)
Basos: 0 %
EOS (ABSOLUTE): 0 10*3/uL (ref 0.0–0.4)
Eos: 0 %
Hematocrit: 43.6 % (ref 37.5–51.0)
Hemoglobin: 15.1 g/dL (ref 13.0–17.7)
Immature Grans (Abs): 0 10*3/uL (ref 0.0–0.1)
Immature Granulocytes: 0 %
Lymphocytes Absolute: 0.9 10*3/uL (ref 0.7–3.1)
Lymphs: 13 %
MCH: 33.5 pg — ABNORMAL HIGH (ref 26.6–33.0)
MCHC: 34.6 g/dL (ref 31.5–35.7)
MCV: 97 fL (ref 79–97)
Monocytes Absolute: 0.5 10*3/uL (ref 0.1–0.9)
Monocytes: 8 %
Neutrophils Absolute: 5.2 10*3/uL (ref 1.4–7.0)
Neutrophils: 79 %
RBC: 4.51 x10E6/uL (ref 4.14–5.80)
RDW: 11.8 % (ref 11.6–15.4)
WBC: 6.7 10*3/uL (ref 3.4–10.8)

## 2021-01-09 LAB — LIPID PANEL
Chol/HDL Ratio: 1.9 ratio (ref 0.0–5.0)
Cholesterol, Total: 216 mg/dL — ABNORMAL HIGH (ref 100–199)
HDL: 116 mg/dL (ref >39)
LDL Chol Calc (NIH): 82 mg/dL (ref 0–99)
Triglycerides: 108 mg/dL (ref 0–149)
VLDL Cholesterol Cal: 18 mg/dL (ref 5–40)

## 2021-01-09 NOTE — Progress Notes (Signed)
[facility] [place] [facility] [address]  [place], [place]  [address] [place] VISIT  01/09/2021  Subjective:  John Stewart is a 75 y.o. male patient of [place] Ament, Coralee Pesa, NP who had a Medicare Annual Wellness Visit today. John Stewart is Retired and lives with their family. he has 2 children. he reports that he is socially active and does interact with friends/family regularly. he is markedly physically active and enjoys gardening and feeding the wild animals that come in his yard.  Patient Care Team: Kaytlynne Neace, Coralee Pesa, NP as PCP - General (Nurse Practitioner)  Advanced Directives 01/09/2021 07/31/2018 06/18/2017 03/21/2015  Does Patient Have a Medical Advance Directive? Yes No No No  Type of Advance Directive [place]  Does patient want to make changes to medical advance directive? No - Patient declined - - -  Copy of [place] in Chart? No - copy requested - - -  Would patient like information on creating a medical advance directive? - - No - Patient declined No - patient declined information    Hospital Utilization Over the Past 12 Months: # of hospitalizations or ER visits: 0 # of surgeries: 0  Review of Systems    Patient reports that his overall health is unchanged when compared to last year.  Review of Systems: Negative.  Pain Assessment Pain : No/denies pain     Current Medications & Allergies (verified) Allergies as of 01/09/2021   No Known Allergies      Medication List        Accurate as of January 09, 2021  5:27 PM. If you have any questions, ask your nurse or doctor.          amLODipine 10 MG tablet Commonly known as: NORVASC Take 1 tablet by mouth once daily   fluticasone 50 MCG/ACT nasal spray Commonly known as: FLONASE Place 2 sprays into both nostrils daily.   hydrochlorothiazide 12.5 MG tablet Commonly  known as: HYDRODIURIL Take 1 tablet by mouth once daily   lisinopril 40 MG tablet Commonly known as: ZESTRIL Take 1 tablet by mouth once daily   Pfizer COVID-19 Vac Bivalent injection Generic drug: COVID-19 mRNA bivalent vaccine (Pfizer) Inject into the muscle.        History (reviewed): Past Medical History:  Diagnosis Date   Abdominal distension 06/18/2017   Acid reflux    Allergy    Hypertension    Lobar pneumonia ([place]) 06/18/2017   Pneumonia 06/20/2017   Sepsis ([place]) 06/18/2017   Past Surgical History:  Procedure Laterality Date   biceps tendon surgery Right    SHOULDER SURGERY     Family History  Problem Relation Age of Onset   Cancer Father    Social History   Socioeconomic History   Marital status: Married    Spouse name: [name]   Number of children: 2   Years of education: Not on file   Highest education level: Not on file  Occupational History   Not on file  Tobacco Use   Smoking status: Never   Smokeless tobacco: Never  Vaping Use   Vaping Use: Never used  Substance and Sexual Activity   Alcohol use: Yes    Alcohol/week: 2.0 standard drinks    Types: 2 Cans of beer per week   Drug use: No   Sexual activity: Not Currently    Birth control/protection: None  Other Topics Concern   Not on file  Social History Narrative   Not on file   Social Determinants of Health   Financial Resource Strain: Low Risk    Difficulty of Paying Living Expenses: Not hard at all  Food Insecurity: No Food Insecurity   Worried About Charity fundraiser in the Last Year: Never true   [place] in the Last Year: Never true  Transportation Needs: No Transportation Needs   Lack of Transportation (Medical): No   Lack of Transportation (Non-Medical): No  Physical Activity: Insufficiently Active   Days of Exercise per Week: 7 days   Minutes of Exercise per Session: 20 min  Stress: Stress Concern Present   Feeling of Stress : To some extent  Social  Connections: Moderately Isolated   Frequency of Communication with Friends and Family: More than three times a week   Frequency of Social Gatherings with Friends and Family: More than three times a week   Attends Religious Services: Never   Marine scientist or Organizations: No   Attends Archivist Meetings: Never   Marital Status: Married    Activities of Daily Living In your present state of health, do you have any difficulty performing the following activities: 01/09/2021 07/04/2020  Hearing? N N  Vision? N N  Difficulty concentrating or making decisions? N N  Walking or climbing stairs? N N  Dressing or bathing? N N  Doing errands, shopping? N N  Preparing Food and eating ? N -  Using the Toilet? N -  In the past six months, have you accidently leaked urine? N -  Do you have problems with loss of bowel control? N -  Managing your Medications? N -  Managing your Finances? N -  Housekeeping or managing your Housekeeping? N -  Some recent data might be hidden    Patient Education/Literacy How often do you need to have someone help you when you read instructions, pamphlets, or other written materials from your doctor or pharmacy?: 1 - Never  Exercise Current Exercise Habits: Home exercise routine, Type of exercise: walking, Time (Minutes): 45, Frequency (Times/Week): 7, Weekly Exercise (Minutes/Week): 315, Intensity: Moderate, Exercise limited by: None identified  Diet Patient reports consuming 2 meals a day and 0 snack(s) a day Patient reports that his primary diet is: Regular Patient reports that she does have regular access to food.   Depression Screen PHQ 2/9 Scores 01/09/2021 07/04/2020 02/11/2016 04/06/2015 02/15/2015 10/29/2014 10/03/2014  PHQ - 2 Score 0 0 0 0 0 0 0     Fall Risk Fall Risk  01/09/2021 07/04/2020 02/11/2016 08/20/2014  Falls in the past year? 0 0 No Yes  Number falls in past yr: 0 0 - 1  Injury with Fall? 0 - - No  Risk for fall due to : No  Fall Risks No Fall Risks - -  Follow up Falls evaluation completed;Education provided;Falls prevention discussed Falls evaluation completed - -     Objective:   BP 133/72   Pulse 90   Ht 5\' 5"  (1.651 m)   Wt 188 lb 6.4 oz (85.5 kg)   SpO2 100%   BMI 31.35 kg/m   Last Weight  Most recent update: 01/09/2021  3:08 PM    Weight  85.5 kg (188 lb 6.4 oz)             Body mass index is 31.35 kg/m.  Hearing/Vision  Nason did not have difficulty with hearing/understanding  during the face-to-face interview Cameren did not have difficulty with his vision during the face-to-face interview Reports that he has had a formal eye exam by an eye care professional within the past year Reports that he has not had a formal hearing evaluation within the past year  Cognitive Function: 6CIT Screen 01/09/2021  What Year? 0 points  What month? 0 points  What time? 0 points  Count back from 20 0 points  Months in reverse 0 points  Repeat phrase 0 points  Total Score 0    Normal Cognitive Function Screening: Yes (Normal:0-7, Significant for Dysfunction: >8)  Immunization & Health Maintenance Record Immunization History  Administered Date(s) Administered   Fluad Quad(high Dose 65+) 01/09/2021   Influenza,inj,Quad PF,6+ Mos 02/13/2013, 02/15/2015, 02/11/2016   PFIZER(Purple Top)SARS-COV-2 Vaccination 01/08/2021   Pfizer Covid-19 Vaccine Bivalent Booster 90yrs & up 01/08/2021   Tdap 04/15/2007, 07/31/2018   Zoster, Live 08/21/2014    Health Maintenance  Topic Date Due   Zoster Vaccines- Shingrix (1 of 2) Never done   COLONOSCOPY (Pts 45-47yrs Insurance coverage will need to be confirmed)  12/13/2018   COVID-19 Vaccine (2 - Pfizer series) 01/29/2021   TETANUS/TDAP  07/30/2028   INFLUENZA VACCINE  Completed   HPV VACCINES  Aged Out   Hepatitis C Screening  Discontinued       Assessment  This is a routine wellness examination for John Stewart.  Health Maintenance: Due or  Overdue Health Maintenance Due  Topic Date Due   Zoster Vaccines- Shingrix (1 of 2) Never done   COLONOSCOPY (Pts 45-23yrs Insurance coverage will need to be confirmed)  12/13/2018    [name] does not need a referral for Community Assistance: Care Management:   no Social Work:    no Prescription Assistance:  no Nutrition/Diabetes Education:  no   Plan:  Personalized Goals  Goals Addressed               This Visit's Progress     Weight (lb) < 200 lb (90.7 kg) (pt-stated)   188 lb 6.4 oz (85.5 kg)     Would like to get down to 175 pounds.       Personalized Health Maintenance & Screening Recommendations  Influenza vaccine  Lung Cancer Screening Recommended: not applicable (Low Dose CT Chest recommended if Age 36-80 years, 30 pack-year currently smoking OR have quit w/in past 15 years) Hepatitis C Screening recommended: not applicable HIV Screening recommended: not applicable  Advanced Directives: Written information was given per the patient's request.  Referrals & Orders Orders Placed This Encounter  Procedures   Flu Vaccine QUAD High Dose(Fluad)   CBC With Differential   Comprehensive metabolic panel   Lipid panel    Follow-up Plan Follow-up with Kamariah Fruchter, Coralee Pesa, NP as planned Schedule 6 months blood pressure    I have personally reviewed and noted the following in the patient's chart:   Medical and social history Use of alcohol, tobacco or illicit drugs  Current medications and supplements Functional ability and status Nutritional status Physical activity Advanced directives List of other physicians Hospitalizations, surgeries, and ER visits in previous 12 months Vitals Screenings to include cognitive, depression, and falls Referrals and appointments  In addition, I have reviewed and discussed with patient certain preventive protocols, quality metrics, and best practice recommendations. A written personalized care plan for preventive services  as well as general preventive health recommendations were provided to patient.     Orma Render, DNP, AGNP-c  01/09/2021      

## 2021-01-21 ENCOUNTER — Telehealth (HOSPITAL_BASED_OUTPATIENT_CLINIC_OR_DEPARTMENT_OTHER): Payer: Self-pay

## 2021-01-21 NOTE — Telephone Encounter (Signed)
-----   Message from Orma Render, NP sent at 01/13/2021  7:53 AM EDT ----- Overall labs look good.  Please call [facility] and add A1c for elevated fasting blood glucose.

## 2021-01-21 NOTE — Telephone Encounter (Signed)
Results reviewed by patient via [place].  Seen on 01/13/2021  4:21 PM Instructed patient to contact the office with any questions or concerns.

## 2021-07-09 ENCOUNTER — Ambulatory Visit (INDEPENDENT_AMBULATORY_CARE_PROVIDER_SITE_OTHER): Payer: Medicare HMO | Admitting: Nurse Practitioner

## 2021-07-09 ENCOUNTER — Encounter (HOSPITAL_BASED_OUTPATIENT_CLINIC_OR_DEPARTMENT_OTHER): Payer: Self-pay | Admitting: Nurse Practitioner

## 2021-07-09 ENCOUNTER — Other Ambulatory Visit: Payer: Self-pay

## 2021-07-09 VITALS — BP 152/88 | HR 74 | Temp 97.8°F | Ht 65.0 in | Wt 185.6 lb

## 2021-07-09 DIAGNOSIS — I10 Essential (primary) hypertension: Secondary | ICD-10-CM | POA: Diagnosis not present

## 2021-07-09 LAB — COMPREHENSIVE METABOLIC PANEL
ALT: 19 IU/L (ref 0–44)
AST: 25 IU/L (ref 0–40)
Albumin/Globulin Ratio: 1.8 (ref 1.2–2.2)
Albumin: 4.1 g/dL (ref 3.7–4.7)
Alkaline Phosphatase: 68 IU/L (ref 44–121)
BUN/Creatinine Ratio: 11 (ref 10–24)
BUN: 9 mg/dL (ref 8–27)
Bilirubin Total: 0.8 mg/dL (ref 0.0–1.2)
CO2: 26 mmol/L (ref 20–29)
Calcium: 9.4 mg/dL (ref 8.6–10.2)
Chloride: 101 mmol/L (ref 96–106)
Creatinine, Ser: 0.84 mg/dL (ref 0.76–1.27)
Globulin, Total: 2.3 g/dL (ref 1.5–4.5)
Glucose: 111 mg/dL — ABNORMAL HIGH (ref 70–99)
Potassium: 4.2 mmol/L (ref 3.5–5.2)
Sodium: 139 mmol/L (ref 134–144)
Total Protein: 6.4 g/dL (ref 6.0–8.5)
eGFR: 92 mL/min/{1.73_m2} (ref >59)

## 2021-07-09 LAB — CBC WITH DIFFERENTIAL/PLATELET
Basophils Absolute: 0.1 10*3/uL (ref 0.0–0.2)
Basos: 1 %
EOS (ABSOLUTE): 0.1 10*3/uL (ref 0.0–0.4)
Eos: 1 %
Hematocrit: 43.3 % (ref 37.5–51.0)
Hemoglobin: 15 g/dL (ref 13.0–17.7)
Immature Grans (Abs): 0 10*3/uL (ref 0.0–0.1)
Immature Granulocytes: 0 %
Lymphocytes Absolute: 0.9 10*3/uL (ref 0.7–3.1)
Lymphs: 21 %
MCH: 33.2 pg — ABNORMAL HIGH (ref 26.6–33.0)
MCHC: 34.6 g/dL (ref 31.5–35.7)
MCV: 96 fL (ref 79–97)
Monocytes Absolute: 0.4 10*3/uL (ref 0.1–0.9)
Monocytes: 9 %
Neutrophils Absolute: 3 10*3/uL (ref 1.4–7.0)
Neutrophils: 68 %
Platelets: 245 10*3/uL (ref 150–450)
RBC: 4.52 x10E6/uL (ref 4.14–5.80)
RDW: 11.8 % (ref 11.6–15.4)
WBC: 4.4 10*3/uL (ref 3.4–10.8)

## 2021-07-09 MED ORDER — LISINOPRIL 40 MG PO TABS: 40.0000 mg | ORAL_TABLET | Freq: Every day | ORAL | 1 refills | Status: DC

## 2021-07-09 MED ORDER — AMLODIPINE BESYLATE 10 MG PO TABS: 10.0000 mg | ORAL_TABLET | Freq: Every day | ORAL | 1 refills | Status: DC

## 2021-07-09 MED ORDER — HYDROCHLOROTHIAZIDE 12.5 MG PO TABS: 12.5000 mg | ORAL_TABLET | Freq: Every day | ORAL | 1 refills | Status: DC

## 2021-07-09 NOTE — Progress Notes (Signed)
? ?Established Patient Office Visit ? ?Subjective:  ?Patient ID: John Stewart, male    DOB: 03/20/1946  Age: 76 y.o. MRN: [id number] ? ?CC:  ?Chief Complaint  ?Patient presents with  ? Hypertension  ? ? ?HPI ?[name] presents for HTN follow-up.  ?HYPERTENSION ?Hypertension status:  not well controlled today   ?Satisfied with current treatment? yes ?Duration of hypertension: chronic ?BP monitoring frequency:  rarely ?BP medication side effects:  no ?Medication compliance: good compliance ?Aspirin: yes ?Recurrent headaches: no ?Visual changes: no ?Palpitations: no ?Dyspnea: no ?Chest pain: no ?Lower extremity edema: no ?Dizzy/lightheaded: no ?Pt reports he did not take his medication this morning prior to visit.  ? ?Outpatient Medications Prior to Visit  ?Medication Sig Dispense Refill  ? fluticasone (FLONASE) 50 MCG/ACT nasal spray Place 2 sprays into both nostrils daily. 16 g 12  ? amLODipine (NORVASC) 10 MG tablet Take 1 tablet by mouth once daily 30 tablet 5  ? hydrochlorothiazide (HYDRODIURIL) 12.5 MG tablet Take 1 tablet by mouth once daily 30 tablet 5  ? lisinopril (ZESTRIL) 40 MG tablet Take 1 tablet by mouth once daily 30 tablet 5  ? COVID-19 mRNA bivalent vaccine, Pfizer, (PFIZER COVID-19 VAC BIVALENT) injection Inject into the muscle. 0.3 mL 0  ? ?No facility-administered medications prior to visit.  ? ? ?No Known Allergies ? ?ROS ?Review of Systems ?All review of systems negative except what is listed in the HPI ? ?  ?Objective:  ?  ?Physical Exam ?Vitals and nursing note reviewed.  ?Constitutional:   ?   General: He is not in acute distress. ?   Appearance: Normal appearance.  ?HENT:  ?   Head: Normocephalic and atraumatic.  ?Eyes:  ?   General: Lids are normal. Vision grossly intact.  ?   Extraocular Movements: Extraocular movements intact.  ?   Conjunctiva/sclera: Conjunctivae normal.  ?   Pupils: Pupils are equal, round, and reactive to light.  ?   Funduscopic exam: ?   Right eye: No  hemorrhage. Red reflex present.     ?   Left eye: No hemorrhage. Red reflex present. ?   Visual Fields: Right eye visual fields normal and left eye visual fields normal.  ?Neck:  ?   Vascular: No carotid bruit.  ?Cardiovascular:  ?   Rate and Rhythm: Normal rate and regular rhythm.  ?   Pulses: Normal pulses.  ?   Heart sounds: Normal heart sounds. No murmur heard. ?Pulmonary:  ?   Effort: Pulmonary effort is normal. No respiratory distress.  ?   Breath sounds: Normal breath sounds.  ?Abdominal:  ?   General: Bowel sounds are normal. There is no distension.  ?   Palpations: Abdomen is soft.  ?   Tenderness: There is no abdominal tenderness. There is no right CVA tenderness, left CVA tenderness or guarding.  ?Musculoskeletal:     ?   General: Normal range of motion.  ?   Cervical back: Normal range of motion.  ?   Right lower leg: No edema.  ?   Left lower leg: No edema.  ?Skin: ?   General: Skin is warm and dry.  ?   Capillary Refill: Capillary refill takes less than 2 seconds.  ?Neurological:  ?   General: No focal deficit present.  ?   Mental Status: He is alert and oriented to person, place, and time.  ?Psychiatric:     ?   Mood and Affect: Mood normal.     ?  Behavior: Behavior normal.     ?   Thought Content: Thought content normal.     ?   Judgment: Judgment normal.  ? ? ?BP (!) 152/88   Pulse 74   Temp 97.8 ?F (36.6 ?C)   Ht '5\' 5"'$  (1.651 m)   Wt 185 lb 9.6 oz (84.2 kg)   SpO2 98%   BMI 30.89 kg/m?  ?Wt Readings from Last 3 Encounters:  ?07/09/21 185 lb 9.6 oz (84.2 kg)  ?01/09/21 188 lb 6.4 oz (85.5 kg)  ?07/04/20 195 lb 9.6 oz (88.7 kg)  ? ?  ?Assessment & Plan:  ? ?Problem List Items Addressed This Visit   ? ? Essential hypertension - Primary  ?  BP elevated in office today, but patient has not taken his medication this morning.  ?No alarm symptoms present at this time.  ?Increased stress from caring for his wife are likely contributing to elevations in BP readings. Stressed importance of caring for  himself so that he is able to care for his wife.  ?Recommend taking medication at the same time every day and avoid skipping doses. We want to see how the medication is working for him at office visits.  ?Will obtain labs today for evaluation.  ?Monitor BP at home with goal < 130/85. If BP is higher with repeat evaluation for more than 2 readings, patient will let us know. ?Plan to f/u in 3 months or sooner if needed.  ?  ?  ? Relevant Medications  ? amLODipine (NORVASC) 10 MG tablet  ? hydrochlorothiazide (HYDRODIURIL) 12.5 MG tablet  ? lisinopril (ZESTRIL) 40 MG tablet  ? Other Relevant Orders  ? Comprehensive metabolic panel (Completed)  ? CBC with Differential/Platelet (Completed)  ? ? ?Meds ordered this encounter  ?Medications  ? DISCONTD: amLODipine (NORVASC) 10 MG tablet  ?  Sig: Take 1 tablet (10 mg total) by mouth daily.  ?  Dispense:  90 tablet  ?  Refill:  1  ? DISCONTD: hydrochlorothiazide (HYDRODIURIL) 12.5 MG tablet  ?  Sig: Take 1 tablet (12.5 mg total) by mouth daily.  ?  Dispense:  90 tablet  ?  Refill:  1  ? DISCONTD: lisinopril (ZESTRIL) 40 MG tablet  ?  Sig: Take 1 tablet (40 mg total) by mouth daily.  ?  Dispense:  90 tablet  ?  Refill:  1  ? amLODipine (NORVASC) 10 MG tablet  ?  Sig: Take 1 tablet (10 mg total) by mouth daily.  ?  Dispense:  90 tablet  ?  Refill:  1  ? hydrochlorothiazide (HYDRODIURIL) 12.5 MG tablet  ?  Sig: Take 1 tablet (12.5 mg total) by mouth daily.  ?  Dispense:  90 tablet  ?  Refill:  1  ? lisinopril (ZESTRIL) 40 MG tablet  ?  Sig: Take 1 tablet (40 mg total) by mouth daily.  ?  Dispense:  90 tablet  ?  Refill:  1  ? ? ?Follow-up: Return in about 3 months (around 10/09/2021) for HTN.  ? ? ?Orma Render, NP ?

## 2021-07-09 NOTE — Patient Instructions (Addendum)
It was a pleasure seeing you today. I hope your time spent with [place] was pleasant and helpful. Please let us know if there is anything we can do to improve the service you receive.  ? ?Today we discussed concerns with: ? ?Essential hypertension ?Monitor for symptoms of chest pain, dizziness, weakness, shortness of breath, or rapid heart beats and let me know if these occur.  ?If you can check your blood pressure at home, I recommend doing that at least a few times a week when you are relaxed and resting so we can make sure it is running good at home.  ?Your blood pressure goal at home is less than 130/80. If it is running higher than this, take a few minutes to relax and check it again. If it keeps running in the 140's/90's I want you to call me and let me know. We may need to make some changes to your medication.  ?We will make sure that you kidney function and blood counts look good today and we will let you know if there are any concerns.  ?Keep up the great exercise! You are doing a really good job! ? ? ?The following orders have been placed for you today: ? ?Orders Placed This Encounter  ?Procedures  ? Comprehensive metabolic panel  ?  Order Specific Question:   Has the patient fasted?  ?  Answer:   Yes  ?  Order Specific Question:   Release to patient  ?  Answer:   Immediate  ? CBC with Differential/Platelet  ?  Order Specific Question:   Release to patient  ?  Answer:   Immediate  ? ? ?Important Office Information ?Lab Results ?If labs were ordered, please note that you will see results through [place] as soon as they come available from [place].  ?It takes up to 5 business days for the results to be routed to me and for me to review them once all of the lab results have come through from [facility]. I will make recommendations based on your results and send these through [place] or someone from the office will call you to discuss. If your labs are abnormal, we may contact you to schedule a visit to discuss the  results and make recommendations.  ?If you have not heard from [place] within 5 business days or you have waited longer than a week and your lab results have not come through on [place], please feel free to call the office or send a message through [place] to follow-up on these labs.  ? ?Referrals ?If referrals were placed today, the office where the referral was sent will contact you either by phone or through [place] to set up scheduling. Please note that it can take up to a week for the referral office to contact you. If you do not hear from them in a week, please contact the referral office directly to inquire about scheduling.  ? ?Condition Treated ?If your condition worsens or you begin to have new symptoms, please schedule a follow-up appointment for further evaluation. If you are not sure if an appointment is needed, you may call the office to leave a message for the nurse and someone will contact you with recommendations.  ?If you have an urgent or life threatening emergency, please do not call the office, but seek emergency evaluation by calling 911 or going to the nearest emergency room for evaluation.  ? ?MyChart and Phone Calls ?Please do not use MyChart for urgent messages. It  may take up to 3 business days for MyChart messages to be read by staff and if they are unable to handle the request, an additional 3 business days for them to be routed to me and for my response.  ?Messages sent to the provider through [place] do not come directly to the provider, please allow time for these messages to be routed and for me to respond.  ?We get a large volume of MyChart messages daily and these are responded to in the order received.  ? ?For urgent messages, please call the office at [phone] and speak with the front office staff or leave a message on the line of my assistant for guidance.  ?We are seeing patients from the hours of 8:00 am through 5:00 pm and calls directly to the nurse may not be answered  immediately due to seeing patients, but your call will be returned as soon as possible.  ?Phone  messages received after 4:00 PM Monday through Thursday may not be returned until the following business day. Phone messages received after 11:00 AM on Friday may not be returned until Monday.  ? ?After Hours ?We share on call hours with providers from other offices. If you have an urgent need after hours that cannot wait until the next business day, please contact the on call provider by calling the office number. A nurse will speak with you and contact the provider if needed for recommendations.  ?If you have an urgent or life threatening emergency after hours, please do not call the on call provider, but seek emergency evaluation by calling 911 or going to the nearest emergency room for evaluation.  ? ?Paperwork ?All paperwork requires a minimum of 5 days to complete and return to you or the designated personnel. Please keep this in mind when bringing in forms or sending requests for paperwork completion to the office.  ?  ?

## 2021-07-15 NOTE — Assessment & Plan Note (Signed)
BP elevated in office today, but patient has not taken his medication this morning.  ?No alarm symptoms present at this time.  ?Increased stress from caring for his wife are likely contributing to elevations in BP readings. Stressed importance of caring for himself so that he is able to care for his wife.  ?Recommend taking medication at the same time every day and avoid skipping doses. We want to see how the medication is working for him at office visits.  ?Will obtain labs today for evaluation.  ?Monitor BP at home with goal < 130/85. If BP is higher with repeat evaluation for more than 2 readings, patient will let us know. ?Plan to f/u in 3 months or sooner if needed.  ?

## 2021-08-29 ENCOUNTER — Emergency Department (HOSPITAL_BASED_OUTPATIENT_CLINIC_OR_DEPARTMENT_OTHER)
Admission: EM | Admit: 2021-08-29 | Discharge: 2021-08-29 | Disposition: A | Discharge status: Home / Self Care | Payer: Medicare HMO | Attending: Emergency Medicine | Admitting: Emergency Medicine

## 2021-08-29 ENCOUNTER — Encounter (HOSPITAL_BASED_OUTPATIENT_CLINIC_OR_DEPARTMENT_OTHER): Payer: Self-pay

## 2021-08-29 ENCOUNTER — Other Ambulatory Visit: Payer: Self-pay

## 2021-08-29 DIAGNOSIS — R030 Elevated blood-pressure reading, without diagnosis of hypertension: Secondary | ICD-10-CM | POA: Diagnosis not present

## 2021-08-29 DIAGNOSIS — I10 Essential (primary) hypertension: Secondary | ICD-10-CM | POA: Insufficient documentation

## 2021-08-29 DIAGNOSIS — Z79899 Other long term (current) drug therapy: Secondary | ICD-10-CM | POA: Insufficient documentation

## 2021-08-29 NOTE — ED Triage Notes (Signed)
Patient here POV from Home.  Endorses History of HTN and takes different Medications for Same with no Recent Changes in Medication but states he has not taken Medications for 3 Weeks due to Life Situation.  147/84 BP Today but states BP was 388-875 Systolic a few days PTA.  Recent Stress due to Family Matters at Home. Mild Headache Today. No Other Symptoms.   NAD Noted during Triage. A&Ox4. Gcs 15. Ambulatory.

## 2021-08-29 NOTE — Discharge Instructions (Signed)
Your blood pressure today was not significantly elevated.  You also were without any chest pain, shortness of breath, changes to your vision, or balance issues.  I recommend you continue to keep a blood pressure diary and follow-up with your primary care provider next week.  Continue taking your current dose of your blood pressure medication.  If you develop any chest pain, shortness of breath, issues with balance or vision please return to the emergency room immediately for evaluation.

## 2021-08-29 NOTE — ED Notes (Signed)
Discharge paperwork given and understood. 

## 2021-08-29 NOTE — ED Provider Notes (Signed)
[facility] EMERGENCY DEPT Provider Note   CSN: [id number] Arrival date & time: 08/29/21  1534     History  Chief Complaint  Patient presents with   Hypertension    John Stewart is a 76 y.o. male.  76 year old male with past medical history of hypertension, GERD presents today for evaluation of elevated blood pressure.  Patient states he has been under significant stress recently.  States his wife suffered a massive stroke about 2-1/2 to 3 weeks ago and recently transitioned to hospice care.  He states over that timeframe he neglected taking his medications.  He realized this about a week ago when he checked his blood pressure and it was significantly elevated about 200/100.  He states he restarted taking his medications on 5/11.  He states since then his blood pressure has improved.  He was talking to his sister today who recommended he be evaluated right away.  He denies chest pain, shortness of breath, visual change, headache, or any other complaints.  He has adequate supply of his home BP medications.  The history is provided by the patient. No language interpreter was used.      Home Medications Prior to Admission medications   Medication Sig Start Date End Date Taking? Authorizing Provider  amLODipine (NORVASC) 10 MG tablet Take 1 tablet (10 mg total) by mouth daily. 07/09/21   Orma Render, NP  fluticasone (FLONASE) 50 MCG/ACT nasal spray Place 2 sprays into both nostrils daily. 07/04/20   Orma Render, NP  hydrochlorothiazide (HYDRODIURIL) 12.5 MG tablet Take 1 tablet (12.5 mg total) by mouth daily. 07/09/21   Orma Render, NP  lisinopril (ZESTRIL) 40 MG tablet Take 1 tablet (40 mg total) by mouth daily. 07/09/21   Orma Render, NP      Allergies    Patient has no known allergies.    Review of Systems   Review of Systems  Constitutional:  Negative for fever.  Eyes:  Negative for visual disturbance.  Respiratory:  Negative for shortness of breath.    Cardiovascular:  Negative for chest pain and leg swelling.  Neurological:  Negative for light-headedness and headaches.  All other systems reviewed and are negative.  Physical Exam Updated Vital Signs BP 128/77   Pulse 68   Temp 98.3 F (36.8 C)   Resp 18   Ht '5\' 5"'$  (1.651 m)   Wt 84.2 kg   SpO2 97%   BMI 30.89 kg/m  Physical Exam Vitals and nursing note reviewed.  Constitutional:      General: He is not in acute distress.    Appearance: Normal appearance. He is not ill-appearing.  HENT:     Head: Normocephalic and atraumatic.     Nose: Nose normal.  Eyes:     General: No scleral icterus.    Extraocular Movements: Extraocular movements intact.     Conjunctiva/sclera: Conjunctivae normal.  Cardiovascular:     Rate and Rhythm: Normal rate and regular rhythm.     Pulses: Normal pulses.     Heart sounds: Normal heart sounds.  Pulmonary:     Effort: Pulmonary effort is normal. No respiratory distress.     Breath sounds: Normal breath sounds. No wheezing or rales.  Abdominal:     General: There is no distension.     Palpations: Abdomen is soft.     Tenderness: There is no abdominal tenderness. There is no guarding.  Musculoskeletal:        General: Normal range of  motion.     Cervical back: Normal range of motion.     Right lower leg: No edema.     Left lower leg: No edema.  Skin:    General: Skin is warm and dry.  Neurological:     General: No focal deficit present.     Mental Status: He is alert and oriented to person, place, and time. Mental status is at baseline.     Cranial Nerves: No cranial nerve deficit.    ED Results / Procedures / Treatments   Labs (all labs ordered are listed, but only abnormal results are displayed) Labs Reviewed - No data to display  EKG None  Radiology No results found.  Procedures Procedures    Medications Ordered in ED Medications - No data to display  ED Course/ Medical Decision Making/ A&P                            Medical Decision Making  76 year old male presents today for evaluation of elevated blood pressure.  Blood pressure much improved compared to what it was 1 week ago when patient was not taken his home medications.  Blood pressure in the emergency room 128/77.  Patient has had similar readings at home prior to arrival over the past 2 to 3 days.  Patient has been checking his blood pressure at home.  He had readings of about 200/100 about a week ago.  He resumed his medications at that time as well.  He is without any signs or symptoms concerning for endorgan damage.  Patient has adequate supplies of his home antihypertensives.  Discussed with patient the importance of compliance with his antihypertensives.  Discussed importance of blood pressure diary and follow-up with his PCP next week.  Patient voices understanding and is in agreement with plan.   Final Clinical Impression(s) / ED Diagnoses Final diagnoses:  Elevated blood pressure reading    Rx / DC Orders ED Discharge Orders     None         Evlyn Courier, PA-C 08/29/21 1857    Wyvonnia Dusky, MD 08/30/21 [phone]

## 2022-01-22 ENCOUNTER — Other Ambulatory Visit (HOSPITAL_BASED_OUTPATIENT_CLINIC_OR_DEPARTMENT_OTHER): Payer: Self-pay

## 2022-01-22 DIAGNOSIS — I10 Essential (primary) hypertension: Secondary | ICD-10-CM

## 2022-01-22 MED ORDER — AMLODIPINE BESYLATE 10 MG PO TABS: 10.0000 mg | ORAL_TABLET | Freq: Every day | ORAL | 0 refills | Status: DC

## 2022-04-22 ENCOUNTER — Encounter: Payer: Self-pay | Admitting: Internal Medicine

## 2022-07-14 ENCOUNTER — Telehealth: Payer: Self-pay | Admitting: Nurse Practitioner

## 2022-07-14 NOTE — Telephone Encounter (Signed)
Contacted John Stewart to schedule their annual wellness visit. Appointment made for 07/21/22.  Shirlean Mylar [phone]

## 2022-07-21 ENCOUNTER — Telehealth: Payer: Self-pay

## 2022-07-21 NOTE — Telephone Encounter (Signed)
This nurse attempted to call patient four times for AWV. Call to mobile number continuously rang and the home number was not in service.

## 2023-11-13 ENCOUNTER — Telehealth

## 2023-11-15 ENCOUNTER — Telehealth: Payer: Self-pay | Admitting: Nurse Practitioner

## 2023-11-15 NOTE — Telephone Encounter (Signed)
 John Stewart came in today and states he just found John Stewart since moving from [place] but also he has not took his meds since his wife passed away two years ago and wants to get back on track. I have scheduled him an appointment but he is requesting refills and still uses Psychologist, forensic. I am not sure if you will refill now or wait until his appointment. I tried to schedule him soon so that he can get back on track sooner.

## 2023-11-17 ENCOUNTER — Other Ambulatory Visit: Payer: Self-pay | Admitting: Nurse Practitioner

## 2023-11-17 DIAGNOSIS — I10 Essential (primary) hypertension: Secondary | ICD-10-CM

## 2023-11-17 MED ORDER — AMLODIPINE BESYLATE 10 MG PO TABS: 10.0000 mg | ORAL_TABLET | Freq: Every day | ORAL | 0 refills | Status: DC

## 2023-11-26 ENCOUNTER — Encounter: Payer: Self-pay | Admitting: Nurse Practitioner

## 2023-11-26 ENCOUNTER — Ambulatory Visit (INDEPENDENT_AMBULATORY_CARE_PROVIDER_SITE_OTHER): Admitting: Nurse Practitioner

## 2023-11-26 VITALS — BP 144/82 | HR 80 | Wt 189.6 lb

## 2023-11-26 DIAGNOSIS — Z1329 Encounter for screening for other suspected endocrine disorder: Secondary | ICD-10-CM | POA: Diagnosis not present

## 2023-11-26 DIAGNOSIS — Z130 Encounter for screening for diseases of the blood and blood-forming organs and certain disorders involving the immune mechanism: Secondary | ICD-10-CM

## 2023-11-26 DIAGNOSIS — I10 Essential (primary) hypertension: Secondary | ICD-10-CM

## 2023-11-26 DIAGNOSIS — Z1321 Encounter for screening for nutritional disorder: Secondary | ICD-10-CM | POA: Diagnosis not present

## 2023-11-26 DIAGNOSIS — Z13228 Encounter for screening for other metabolic disorders: Secondary | ICD-10-CM | POA: Diagnosis not present

## 2023-11-26 MED ORDER — OLMESARTAN MEDOXOMIL 20 MG PO TABS: 20.0000 mg | ORAL_TABLET | Freq: Every day | ORAL | 0 refills | Status: DC

## 2023-11-26 MED ORDER — HYDROCHLOROTHIAZIDE 12.5 MG PO TABS: 12.5000 mg | ORAL_TABLET | Freq: Every day | ORAL | 0 refills | Status: DC

## 2023-11-26 NOTE — Patient Instructions (Addendum)
 It was a pleasure to see you today! I am so happy we are getting you back on your medication.   I have sent in 2 more medications for your blood pressure at low doses to help decide what combination will work best.   We will give you 2 weeks on the three medications to get an idea of where your blood pressures are sitting.

## 2023-11-26 NOTE — Progress Notes (Signed)
 Catheline Doing, DNP, AGNP-c [facility]  [address] [place], [place] [address] [phone]  ESTABLISHED PATIENT- Chronic Health and/or Follow-Up Visit on 11/26/2023  Blood pressure (!) 144/82, pulse 80, weight 189 lb 9.6 oz (86 kg).   HPI:  History of Present Illness John Stewart is a 78 year old male with hypertension who presents for blood pressure management.  He has been experiencing consistently high blood pressure readings at home, despite using two different machines. The readings fluctuate by about twenty percent. He checks his blood pressure twice daily, noting that it remains in the range of 160-170 mmHg. He uses a sophisticated blood pressure monitor that indicates he is in hypertension stage two, and a secondary wrist monitor that also shows high readings.  He experiences slight headaches every two to three weeks, which he attributes to caffeine withdrawal, and these resolve quickly after consuming caffeine. No vision changes, dizziness, or fatigue.  He is currently taking amlodipine , lisinopril , and hydrochlorothiazide  for hypertension. However, he stopped all medications two years ago following the death of his wife, which caused significant emotional distress. He has recently resumed medication after receiving a new prescription.  His wife passed away two years ago after a prolonged illness, which included three months in hospice care. This event led to significant emotional distress, resulting in him discarding all his medications at that time. He has since been managing his grief by maintaining a memorial for her at home and engaging in activities he enjoyed together.  He walks daily with his 23 year old dog, aiming for at least 1,000 steps per day, and reports achieving 1,300 steps on a recent walk. He has no swelling in his feet and maintains a physically active lifestyle.  All ROS negative with exception of what is listed above.    PHYSICAL  EXAM Physical Exam Vitals and nursing note reviewed.  Constitutional:      Appearance: Normal appearance.  HENT:     Head: Normocephalic.  Eyes:     Pupils: Pupils are equal, round, and reactive to light.  Cardiovascular:     Rate and Rhythm: Normal rate and regular rhythm.     Pulses: Normal pulses.     Heart sounds: Normal heart sounds.  Pulmonary:     Effort: Pulmonary effort is normal.     Breath sounds: Normal breath sounds.  Abdominal:     General: Bowel sounds are normal.     Palpations: Abdomen is soft.  Musculoskeletal:     Cervical back: Normal range of motion.     Right lower leg: No edema.     Left lower leg: No edema.  Lymphadenopathy:     Cervical: No cervical adenopathy.  Skin:    General: Skin is warm and dry.     Capillary Refill: Capillary refill takes less than 2 seconds.  Neurological:     Mental Status: He is alert and oriented to person, place, and time.  Psychiatric:        Mood and Affect: Mood normal.        Behavior: Behavior normal.      PLAN Problem List Items Addressed This Visit     Essential hypertension - Primary   Hypertension Hypertension with consistently elevated blood pressure readings at home (160-170 mmHg). Occasional mild headaches every two to three weeks, resolving quickly. No vision changes or pedal edema. Previously discontinued amlodipine , lisinopril , and hydrochlorothiazide  due to emotional distress. Currently on amlodipine , but blood pressure remains elevated. - Prescribe olmesartan  20 mg  to replace lisinopril , with potential dosage increase if needed. - Continue amlodipine  and hydrochlorothiazide . - Monitor blood pressure at home daily and record readings. - Schedule follow-up in four weeks to assess blood pressure control and adjust medications if necessary. - Order laboratory tests for kidney function, liver function, and complete blood count.      Relevant Medications   olmesartan  (BENICAR ) 20 MG tablet    hydrochlorothiazide  (HYDRODIURIL ) 12.5 MG tablet   Other Relevant Orders   CBC with Differential/Platelet   CMP14+EGFR   Hemoglobin A1c   Lipid panel   Other Visit Diagnoses       Screening for endocrine, nutritional, metabolic and immunity disorder       Relevant Orders   CBC with Differential/Platelet   CMP14+EGFR   Hemoglobin A1c   Lipid panel        Return in about 4 weeks (around 12/24/2023) for Med Management 30.  SaraBeth Nalda Shackleford, DNP, AGNP-c Time: 36 minutes, >50% spent counseling, care coordination, chart review, and documentation.  SABRAtime

## 2023-12-06 NOTE — Assessment & Plan Note (Signed)
 Hypertension Hypertension with consistently elevated blood pressure readings at home (160-170 mmHg). Occasional mild headaches every two to three weeks, resolving quickly. No vision changes or pedal edema. Previously discontinued amlodipine , lisinopril , and hydrochlorothiazide  due to emotional distress. Currently on amlodipine , but blood pressure remains elevated. - Prescribe olmesartan  20 mg to replace lisinopril , with potential dosage increase if needed. - Continue amlodipine  and hydrochlorothiazide . - Monitor blood pressure at home daily and record readings. - Schedule follow-up in four weeks to assess blood pressure control and adjust medications if necessary. - Order laboratory tests for kidney function, liver function, and complete blood count.

## 2023-12-27 ENCOUNTER — Encounter: Payer: Self-pay | Admitting: Nurse Practitioner

## 2023-12-27 ENCOUNTER — Ambulatory Visit (INDEPENDENT_AMBULATORY_CARE_PROVIDER_SITE_OTHER): Admitting: Nurse Practitioner

## 2023-12-27 VITALS — BP 138/92 | HR 87 | Wt 195.4 lb

## 2023-12-27 DIAGNOSIS — K219 Gastro-esophageal reflux disease without esophagitis: Secondary | ICD-10-CM

## 2023-12-27 DIAGNOSIS — Z1321 Encounter for screening for nutritional disorder: Secondary | ICD-10-CM

## 2023-12-27 DIAGNOSIS — F4320 Adjustment disorder, unspecified: Secondary | ICD-10-CM | POA: Insufficient documentation

## 2023-12-27 DIAGNOSIS — I10 Essential (primary) hypertension: Secondary | ICD-10-CM

## 2023-12-27 DIAGNOSIS — Z130 Encounter for screening for diseases of the blood and blood-forming organs and certain disorders involving the immune mechanism: Secondary | ICD-10-CM | POA: Diagnosis not present

## 2023-12-27 DIAGNOSIS — G4709 Other insomnia: Secondary | ICD-10-CM

## 2023-12-27 DIAGNOSIS — Z13228 Encounter for screening for other metabolic disorders: Secondary | ICD-10-CM | POA: Diagnosis not present

## 2023-12-27 DIAGNOSIS — Z1329 Encounter for screening for other suspected endocrine disorder: Secondary | ICD-10-CM | POA: Diagnosis not present

## 2023-12-27 LAB — LIPID PANEL

## 2023-12-27 MED ORDER — FAMOTIDINE 20 MG PO TABS: 20.0000 mg | ORAL_TABLET | Freq: Two times a day (BID) | ORAL | 3 refills | Status: AC | PRN

## 2023-12-27 MED ORDER — TRAZODONE HCL 50 MG PO TABS: 25.0000 mg | ORAL_TABLET | Freq: Every evening | ORAL | 5 refills | Status: AC | PRN

## 2023-12-27 MED ORDER — OLMESARTAN MEDOXOMIL 20 MG PO TABS: 40.0000 mg | ORAL_TABLET | Freq: Every day | ORAL | Status: DC

## 2023-12-27 NOTE — Patient Instructions (Addendum)
 I would like you to take your medication as follows:  Amlodipine  1 tablet (10mg ) Hydrochlorothiazide  1 tablet (12.5mg ) Olmesartan  2 tablets (40mg )  Over the next 2 weeks, keep a check on your Blood Pressure at home- you can even come into the office to have one of the nurses check your blood pressure once or twice, if you want to make sure it is right with your cuff.   If this looks better - average blood pressures are less than 130/80, then I will send in the combination pill for all three.   I have sent the Famotidine  over to [facility] for you. You can take this twice a day if you need it for acid reflux.  I have sent in a medication called Trazodone  that can help with mood and sleep. You can take this at bed about 30 minutes before you want to go to sleep.  You can take 1/2 tab all the way up to 2 tabs. Let me know what dose is working best for you.  If this is not working well, let me know and we will plan to make changes.

## 2023-12-27 NOTE — Assessment & Plan Note (Signed)
 Blood pressure readings are variable, with some significantly elevated and others more controlled. He is using the machine accurately and remaining seated and still with readings. He reports regular exercise and no chest pain or palpitations. Current regimen includes olmesartan , amlodipine , and hydrochlorothiazide . We discussed that his machine readings and the manual readings are significantly off today, which could indicate that his machine is not working well. I do recommend checking the batteries and consider replacement if the readings remain very elevated at home.  - Increase olmesartan  to two tablets daily. - Continue current doses of amlodipine  and hydrochlorothiazide . - Advise changing batteries in the home blood pressure monitor and consider purchasing a new machine if readings remain high. - Encourage checking blood pressure with office nurses for accuracy. - Send a MyChart message in two weeks to assess blood pressure control.

## 2023-12-27 NOTE — Progress Notes (Signed)
 Catheline Doing, DNP, AGNP-c [facility]  [address] [place], [place] [address] [phone]  ESTABLISHED PATIENT- Chronic Health and/or Follow-Up Visit on 12/27/2023  Blood pressure (!) 138/92, pulse 87, weight 195 lb 6.4 oz (88.6 kg).   HPI: History of Present Illness John Stewart is a 78 year old male with hypertension who presents for blood pressure management.  He has been experiencing elevated blood pressure readings at home. He has brought his readings in today to review. He is concerned with the accuracy of his current BP monitor given the difference in the readings in the office and at home. He is currently taking amlodipine , hydrochlorothiazide , and olmesartan .  He experiences headaches approximately once every couple of weeks, which he attributes to caffeine intake. These headaches are not persistent and have become less frequent over time.  He occasionally experiences acid reflux, which can be quite painful. He previously used over-the-counter famotidine . His grandfather had severe acid reflux that led to complications.  He has difficulty sleeping, often not falling asleep until 2 or 3 AM, which he attributes to grief and sadness following the loss of his wife. He describes feeling sadness and grief but denies SI.  He maintains an active lifestyle, regularly mowing the lawn and walking his dog daily. He lives alone and is widowed.  Machine read 180/102- Manual read 160/84   All ROS negative with exception of what is listed above.    PHYSICAL EXAM Physical Exam Vitals and nursing note reviewed.  Constitutional:      Appearance: Normal appearance.  HENT:     Head: Normocephalic.  Eyes:     Pupils: Pupils are equal, round, and reactive to light.  Neck:     Vascular: No carotid bruit.  Cardiovascular:     Rate and Rhythm: Normal rate and regular rhythm.     Pulses: Normal pulses.     Heart sounds: Normal heart sounds.  Pulmonary:      Effort: Pulmonary effort is normal.     Breath sounds: Normal breath sounds.  Abdominal:     Palpations: Abdomen is soft.  Musculoskeletal:        General: Normal range of motion.     Cervical back: Normal range of motion.     Left lower leg: No edema.  Skin:    General: Skin is warm.     Capillary Refill: Capillary refill takes less than 2 seconds.  Neurological:     General: No focal deficit present.     Mental Status: He is alert and oriented to person, place, and time.  Psychiatric:        Mood and Affect: Mood normal.        Behavior: Behavior normal.     PLAN Problem List Items Addressed This Visit     Essential hypertension - Primary   Blood pressure readings are variable, with some significantly elevated and others more controlled. He is using the machine accurately and remaining seated and still with readings. He reports regular exercise and no chest pain or palpitations. Current regimen includes olmesartan , amlodipine , and hydrochlorothiazide . We discussed that his machine readings and the manual readings are significantly off today, which could indicate that his machine is not working well. I do recommend checking the batteries and consider replacement if the readings remain very elevated at home.  - Increase olmesartan  to two tablets daily. - Continue current doses of amlodipine  and hydrochlorothiazide . - Advise changing batteries in the home blood pressure monitor and consider  purchasing a new machine if readings remain high. - Encourage checking blood pressure with office nurses for accuracy. - Send a MyChart message in two weeks to assess blood pressure control.      Relevant Medications   olmesartan  (BENICAR ) 20 MG tablet   Other Relevant Orders   Hemoglobin A1c   Comprehensive metabolic panel with GFR   CBC with Differential/Platelet   Lipid panel   Acid reflux   Occasional acid reflux with burning sensation. He recalls using famotidine  in the past with good  effect. Family history of severe complications from untreated GERD. - Prescribe famotidine , to be taken up to twice daily as needed. - Advise that daily use is acceptable if symptoms persist.      Relevant Medications   famotidine  (PEPCID ) 20 MG tablet   Grief reaction   Insomnia persists, likely related to grief following the loss of his wife. He reports difficulty sleeping before 2 AM and feelings of sadness. He denies any SI.HI. We will plan to address sleep initially and can consider additional treatment options if needed if this is not fully effective.  - Prescribe trazodone  for sleep and mood, with dosing flexibility from half a tablet to two tablets as needed. - Advise monitoring for grogginess and adjust dosage accordingly. - Discuss the option of using medication as needed, especially during difficult times such as anniversaries or birthdays.      Relevant Medications   traZODone  (DESYREL ) 50 MG tablet   Other Visit Diagnoses       Screening for endocrine, nutritional, metabolic and immunity disorder       Relevant Orders   Hemoglobin A1c   Comprehensive metabolic panel with GFR   CBC with Differential/Platelet   Lipid panel     Other insomnia       Relevant Medications   traZODone  (DESYREL ) 50 MG tablet       Return in about 2 months (around 02/26/2024) for BP and Sleep/Grief (46).  SaraBeth Kazia Grisanti, DNP, AGNP-c Time: 42 minutes, >50% spent counseling, care coordination, chart review, and documentation.  SABRAtime

## 2023-12-27 NOTE — Assessment & Plan Note (Signed)
 Insomnia persists, likely related to grief following the loss of his wife. He reports difficulty sleeping before 2 AM and feelings of sadness. He denies any SI.HI. We will plan to address sleep initially and can consider additional treatment options if needed if this is not fully effective.  - Prescribe trazodone  for sleep and mood, with dosing flexibility from half a tablet to two tablets as needed. - Advise monitoring for grogginess and adjust dosage accordingly. - Discuss the option of using medication as needed, especially during difficult times such as anniversaries or birthdays.

## 2023-12-27 NOTE — Assessment & Plan Note (Signed)
 Occasional acid reflux with burning sensation. He recalls using famotidine  in the past with good effect. Family history of severe complications from untreated GERD. - Prescribe famotidine , to be taken up to twice daily as needed. - Advise that daily use is acceptable if symptoms persist.

## 2023-12-28 LAB — CBC WITH DIFFERENTIAL/PLATELET
Basophils Absolute: 0 x10E3/uL (ref 0.0–0.2)
Basos: 1 %
EOS (ABSOLUTE): 0.1 x10E3/uL (ref 0.0–0.4)
Eos: 1 %
Hematocrit: 42.5 % (ref 37.5–51.0)
Hemoglobin: 14.8 g/dL (ref 13.0–17.7)
Immature Grans (Abs): 0 x10E3/uL (ref 0.0–0.1)
Immature Granulocytes: 0 %
Lymphocytes Absolute: 1 x10E3/uL (ref 0.7–3.1)
Lymphs: 16 %
MCH: 35 pg — ABNORMAL HIGH (ref 26.6–33.0)
MCHC: 34.8 g/dL (ref 31.5–35.7)
MCV: 101 fL — ABNORMAL HIGH (ref 79–97)
Monocytes Absolute: 0.5 x10E3/uL (ref 0.1–0.9)
Monocytes: 8 %
Neutrophils Absolute: 4.7 x10E3/uL (ref 1.4–7.0)
Neutrophils: 74 %
Platelets: 234 x10E3/uL (ref 150–450)
RBC: 4.23 x10E6/uL (ref 4.14–5.80)
RDW: 11.8 % (ref 11.6–15.4)
WBC: 6.2 x10E3/uL (ref 3.4–10.8)

## 2023-12-28 LAB — COMPREHENSIVE METABOLIC PANEL WITH GFR
ALT: 22 IU/L (ref 0–44)
AST: 33 IU/L (ref 0–40)
Albumin: 4.1 g/dL (ref 3.8–4.8)
Alkaline Phosphatase: 75 IU/L (ref 49–135)
BUN/Creatinine Ratio: 9 — AB (ref 10–24)
BUN: 8 mg/dL (ref 8–27)
Bilirubin Total: 0.7 mg/dL (ref 0.0–1.2)
CO2: 23 mmol/L (ref 20–29)
Calcium: 8.9 mg/dL (ref 8.6–10.2)
Chloride: 99 mmol/L (ref 96–106)
Creatinine, Ser: 0.85 mg/dL (ref 0.76–1.27)
Globulin, Total: 2.7 g/dL (ref 1.5–4.5)
Glucose: 108 mg/dL — AB (ref 70–99)
Potassium: 3.3 mmol/L — ABNORMAL LOW (ref 3.5–5.2)
Sodium: 139 mmol/L (ref 134–144)
Total Protein: 6.8 g/dL (ref 6.0–8.5)
eGFR: 89 mL/min/1.73 (ref >59)

## 2023-12-28 LAB — HEMOGLOBIN A1C
Est. average glucose Bld gHb Est-mCnc: 111 mg/dL
Hgb A1c MFr Bld: 5.5 % (ref 4.8–5.6)

## 2023-12-28 LAB — LIPID PANEL
Cholesterol, Total: 198 mg/dL (ref 100–199)
HDL: 116 mg/dL (ref >39)
LDL CALC COMMENT:: 1.7 ratio (ref 0.0–5.0)
LDL Chol Calc (NIH): 69 mg/dL (ref 0–99)
Triglycerides: 76 mg/dL (ref 0–149)
VLDL Cholesterol Cal: 13 mg/dL (ref 5–40)

## 2023-12-29 ENCOUNTER — Ambulatory Visit: Payer: Self-pay | Admitting: Nurse Practitioner

## 2023-12-29 DIAGNOSIS — E876 Hypokalemia: Secondary | ICD-10-CM

## 2024-01-11 MED ORDER — OLMESARTAN-AMLODIPINE-HCTZ 40-10-25 MG PO TABS
ORAL_TABLET | ORAL | 0 refills | Status: DC
Start: 2024-01-11 — End: 2024-02-29

## 2024-01-19 ENCOUNTER — Other Ambulatory Visit

## 2024-01-19 DIAGNOSIS — E876 Hypokalemia: Secondary | ICD-10-CM

## 2024-01-19 LAB — POTASSIUM: Potassium: 3.9 mmol/L (ref 3.5–5.2)

## 2024-01-27 ENCOUNTER — Ambulatory Visit: Payer: Self-pay | Admitting: Nurse Practitioner

## 2024-02-03 ENCOUNTER — Ambulatory Visit

## 2024-02-03 VITALS — BP 159/93 | HR 91 | Ht 65.0 in | Wt 191.0 lb

## 2024-02-03 DIAGNOSIS — Z0000 Encounter for general adult medical examination without abnormal findings: Secondary | ICD-10-CM | POA: Diagnosis not present

## 2024-02-03 NOTE — Patient Instructions (Signed)
 [name],  Thank you for taking the time for your Medicare Wellness Visit. I appreciate your continued commitment to your health goals. Please review the care plan we discussed, and feel free to reach out if I can assist you further.  Medicare recommends these wellness visits once per year to help you and your care team stay ahead of potential health issues. These visits are designed to focus on prevention, allowing your provider to concentrate on managing your acute and chronic conditions during your regular appointments.  Please note that Annual Wellness Visits do not include a physical exam. Some assessments may be limited, especially if the visit was conducted virtually. If needed, we may recommend a separate in-person follow-up with your provider.  Ongoing Care Seeing your primary care provider every 3 to 6 months helps us  monitor your health and provide consistent, personalized care.   Referrals If a referral was made during today's visit and you haven't received any updates within two weeks, please contact the referred provider directly to check on the status.  Recommended Screenings:  Health Maintenance  Topic Date Due   COVID-19 Vaccine (3 - 2025-26 season) 06/25/2024*   Zoster (Shingles) Vaccine (1 of 2) 06/25/2024*   Flu Shot  07/26/2024*   Pneumococcal Vaccine for age over 59 (1 of 1 - PCV) 12/26/2024*   Medicare Annual Wellness Visit  02/02/2025   DTaP/Tdap/Td vaccine (3 - Td or Tdap) 07/30/2028   Meningitis B Vaccine  Aged Out   Colon Cancer Screening  Discontinued   Hepatitis C Screening  Discontinued  *Topic was postponed. The date shown is not the original due date.       02/03/2024   11:35 AM  Advanced Directives  Does Patient Have a Medical Advance Directive? No  Would patient like information on creating a medical advance directive? No - Patient declined   Advance Care Planning is important because it: Ensures you receive medical care that aligns with your  values, goals, and preferences. Provides guidance to your family and loved ones, reducing the emotional burden of decision-making during critical moments.  Vision: Annual vision screenings are recommended for early detection of glaucoma, cataracts, and diabetic retinopathy. These exams can also reveal signs of chronic conditions such as diabetes and high blood pressure.  Dental: Annual dental screenings help detect early signs of oral cancer, gum disease, and other conditions linked to overall health, including heart disease and diabetes.  Please see the attached documents for additional preventive care recommendations.

## 2024-02-03 NOTE — Progress Notes (Signed)
 Subjective:   John Stewart is a 78 y.o. who presents for a Medicare Wellness preventive visit.  As a reminder, Annual Wellness Visits don't include a physical exam, and some assessments may be limited, especially if this visit is performed virtually. We may recommend an in-person follow-up visit with your provider if needed.  Visit Complete: Virtual I connected with  John Stewart on 02/03/24 by a video and audio enabled telemedicine application and verified that I am speaking with the correct person using two identifiers.  Patient Location: Home  Provider Location: Home Office  I discussed the limitations of evaluation and management by telemedicine. The patient expressed understanding and agreed to proceed.  Vital Signs: Because this visit was a virtual/telehealth visit, some criteria may be missing or patient reported. Any vitals not documented were not able to be obtained and vitals that have been documented are patient reported.    Persons Participating in Visit: Patient.  AWV Questionnaire: No: Patient Medicare AWV questionnaire was not completed prior to this visit.  Cardiac Risk Factors include: advanced age (>109men, >49 women);hypertension;male gender     Objective:    Today's Vitals   02/03/24 1123  BP: (!) 159/93  Pulse: 91  Weight: 191 lb (86.6 kg)  Height: 5' 5 (1.651 m)   Body mass index is 31.78 kg/m.     02/03/2024   11:35 AM 08/29/2021    3:57 PM 01/09/2021    3:37 PM 07/31/2018    2:25 PM 06/18/2017    6:00 AM 03/21/2015   12:59 PM  Advanced Directives  Does Patient Have a Medical Advance Directive? No No Yes No No  No   Type of Advance Directive   Healthcare Power of Attorney     Does patient want to make changes to medical advance directive?   No - Patient declined     Copy of Healthcare Power of Attorney in Chart?   No - copy requested     Would patient like information on creating a medical advance directive? No - Patient declined No -  Patient declined   No - Patient declined  No - patient declined information      Data saved with a previous flowsheet row definition    Current Medications (verified) Outpatient Encounter Medications as of 02/03/2024  Medication Sig   famotidine  (PEPCID ) 20 MG tablet Take 1 tablet (20 mg total) by mouth 2 (two) times daily as needed for heartburn or indigestion. (Patient taking differently: Take 20 mg by mouth daily.)   fluticasone  (FLONASE ) 50 MCG/ACT nasal spray Place 2 sprays into both nostrils daily.   Olmesartan -amLODIPine -HCTZ 40-10-25 MG TABS Take 1 tablet daily by mouth for blood pressure.   traZODone  (DESYREL ) 50 MG tablet Take 0.5-2 tablets (25-100 mg total) by mouth at bedtime as needed for sleep.   No facility-administered encounter medications on file as of 02/03/2024.    Allergies (verified) Patient has no known allergies.   History: Past Medical History:  Diagnosis Date   Abdominal distension 06/18/2017   Acid reflux    Allergy    Hypertension    Lobar pneumonia 06/18/2017   Pneumonia 06/20/2017   Sepsis (HCC) 06/18/2017   Past Surgical History:  Procedure Laterality Date   biceps tendon surgery Right    SHOULDER SURGERY     Family History  Problem Relation Age of Onset   Cancer Father    Social History   Socioeconomic History   Marital status: Widowed    Spouse  name: Ronal Bullion   Number of children: 2   Years of education: Not on file   Highest education level: Not on file  Occupational History   Not on file  Tobacco Use   Smoking status: Never   Smokeless tobacco: Never  Vaping Use   Vaping status: Never Used  Substance and Sexual Activity   Alcohol use: Yes    Alcohol/week: 2.0 standard drinks of alcohol    Types: 2 Cans of beer per week   Drug use: No   Sexual activity: Not Currently    Birth control/protection: None  Other Topics Concern   Not on file  Social History Narrative   Not on file   Social Drivers of Health   Financial  Resource Strain: Low Risk  (02/03/2024)   Overall Financial Resource Strain (CARDIA)    Difficulty of Paying Living Expenses: Not hard at all  Food Insecurity: No Food Insecurity (02/03/2024)   Hunger Vital Sign    Worried About Running Out of Food in the Last Year: Never true    Ran Out of Food in the Last Year: Never true  Transportation Needs: No Transportation Needs (02/03/2024)   PRAPARE - Administrator, Civil Service (Medical): No    Lack of Transportation (Non-Medical): No  Physical Activity: Sufficiently Active (02/03/2024)   Exercise Vital Sign    Days of Exercise per Week: 7 days    Minutes of Exercise per Session: 30 min  Stress: No Stress Concern Present (02/03/2024)   [employer] of Occupational Health - Occupational Stress Questionnaire    Feeling of Stress: Not at all  Social Connections: Socially Isolated (02/03/2024)   Social Connection and Isolation Panel    Frequency of Communication with Friends and Family: More than three times a week    Frequency of Social Gatherings with Friends and Family: Never    Attends Religious Services: Never    Database administrator or Organizations: No    Attends Banker Meetings: Never    Marital Status: Widowed    Tobacco Counseling Counseling given: Not Answered    Clinical Intake:  Pre-visit preparation completed: Yes  Pain : No/denies pain     Nutritional Status: BMI > 30  Obese Nutritional Risks: None Diabetes: No  Lab Results  Component Value Date   HGBA1C 5.5 12/27/2023   HGBA1C 5.3 02/11/2016     How often do you need to have someone help you when you read instructions, pamphlets, or other written materials from your doctor or pharmacy?: 1 - Never  Interpreter Needed?: No  Information entered by :: NAllen LPN   Activities of Daily Living     02/03/2024   11:27 AM  In your present state of health, do you have any difficulty performing the following activities:   Hearing? 0  Vision? 0  Difficulty concentrating or making decisions? 0  Walking or climbing stairs? 0  Dressing or bathing? 0  Doing errands, shopping? 0  Preparing Food and eating ? N  Using the Toilet? N  In the past six months, have you accidently leaked urine? N  Do you have problems with loss of bowel control? N  Managing your Medications? N  Managing your Finances? N  Housekeeping or managing your Housekeeping? N    Patient Care Team: Early, Sara E, NP as PCP - General (Nurse Practitioner)  I have updated your Care Teams any recent Medical Services you may have received from other providers in  the past year.     Assessment:   This is a routine wellness examination for Harinder.  Hearing/Vision screen Hearing Screening - Comments:: Denies hearing issues Vision Screening - Comments:: No regular eye exams, [facility]   Goals Addressed             This Visit's Progress    Patient Stated       02/03/2024, denies goals       Depression Screen     02/03/2024   11:37 AM 11/26/2023    3:11 PM 07/09/2021   10:46 AM 01/09/2021    5:27 PM 07/04/2020    6:15 PM 02/11/2016    2:36 PM 04/06/2015    8:30 AM  PHQ 2/9 Scores  PHQ - 2 Score 0 6 0 0 0 0 0  PHQ- 9 Score 0 9         Fall Risk     02/03/2024   11:36 AM 11/26/2023    3:10 PM 07/09/2021   10:46 AM 01/09/2021    3:41 PM 07/04/2020    6:16 PM  Fall Risk   Falls in the past year? 0 0 0 0 0  Number falls in past yr: 0 0 0 0 0  Injury with Fall? 0 0 0 0   Risk for fall due to : Medication side effect No Fall Risks No Fall Risks No Fall Risks No Fall Risks  Follow up Falls evaluation completed;Falls prevention discussed Falls evaluation completed Falls evaluation completed  Falls evaluation completed;Education provided;Falls prevention discussed  Falls evaluation completed      Data saved with a previous flowsheet row definition    MEDICARE RISK AT HOME:  Medicare Risk at Home Any stairs in or around the  home?: Yes If so, are there any without handrails?: No Home free of loose throw rugs in walkways, pet beds, electrical cords, etc?: Yes Adequate lighting in your home to reduce risk of falls?: Yes Life alert?: No Use of a cane, walker or w/c?: No Grab bars in the bathroom?: Yes Shower chair or bench in shower?: No Elevated toilet seat or a handicapped toilet?: Yes  TIMED UP AND GO:  Was the test performed?  No  Cognitive Function: 6CIT completed        02/03/2024   11:39 AM 01/09/2021    3:42 PM  6CIT Screen  What Year? 0 points 0 points  What month? 0 points 0 points  What time? 0 points 0 points  Count back from 20 0 points 0 points  Months in reverse 0 points 0 points  Repeat phrase 0 points 0 points  Total Score 0 points 0 points    Immunizations Immunization History  Administered Date(s) Administered   Fluad Quad(high Dose 65+) 01/09/2021   Influenza,inj,Quad PF,6+ Mos 02/13/2013, 02/15/2015, 02/11/2016   PFIZER(Purple Top)SARS-COV-2 Vaccination 01/08/2021   Pfizer Covid-19 Vaccine Bivalent Booster 31yrs & up 01/08/2021   Tdap 04/15/2007, 07/31/2018   Zoster, Live 08/21/2014    Screening Tests Health Maintenance  Topic Date Due   COVID-19 Vaccine (3 - 2025-26 season) 06/25/2024 (Originally 12/13/2023)   Zoster Vaccines- Shingrix (1 of 2) 06/25/2024 (Originally 07/26/1996)   Influenza Vaccine  07/26/2024 (Originally 11/12/2023)   Pneumococcal Vaccine: 50+ Years (1 of 1 - PCV) 12/26/2024 (Originally 07/26/1996)   Medicare Annual Wellness (AWV)  02/02/2025   DTaP/Tdap/Td (3 - Td or Tdap) 07/30/2028   Meningococcal B Vaccine  Aged Out   Colonoscopy  Discontinued   Hepatitis C Screening  Discontinued    Health Maintenance Items Addressed: Due for vaccines.  Additional Screening:  Vision Screening: Recommended annual ophthalmology exams for early detection of glaucoma and other disorders of the eye. Is the patient up to date with their annual eye exam?  No  Who  is the provider or what is the name of the office in which the patient attends annual eye exams? [facility]  Dental Screening: Recommended annual dental exams for proper oral hygiene  Community Resource Referral / Chronic Care Management: CRR required this visit?  No   CCM required this visit?  No   Plan:    I have personally reviewed and noted the following in the patient's chart:   Medical and social history Use of alcohol, tobacco or illicit drugs  Current medications and supplements including opioid prescriptions. Patient is not currently taking opioid prescriptions. Functional ability and status Nutritional status Physical activity Advanced directives List of other physicians Hospitalizations, surgeries, and ER visits in previous 12 months Vitals Screenings to include cognitive, depression, and falls Referrals and appointments  In addition, I have reviewed and discussed with patient certain preventive protocols, quality metrics, and best practice recommendations. A written personalized care plan for preventive services as well as general preventive health recommendations were provided to patient.   Ardella FORBES Dawn, LPN   [id number]   After Visit Summary: (MyChart) Due to this being a telephonic visit, the after visit summary with patients personalized plan was offered to patient via MyChart   Notes: Nothing significant to report at this time.

## 2024-02-24 ENCOUNTER — Other Ambulatory Visit: Payer: Self-pay | Admitting: Nurse Practitioner

## 2024-02-24 DIAGNOSIS — I10 Essential (primary) hypertension: Secondary | ICD-10-CM

## 2024-02-28 DIAGNOSIS — H5203 Hypermetropia, bilateral: Secondary | ICD-10-CM | POA: Diagnosis not present

## 2024-02-28 DIAGNOSIS — H524 Presbyopia: Secondary | ICD-10-CM | POA: Diagnosis not present

## 2024-02-28 DIAGNOSIS — H25813 Combined forms of age-related cataract, bilateral: Secondary | ICD-10-CM | POA: Diagnosis not present

## 2024-02-28 DIAGNOSIS — H52223 Regular astigmatism, bilateral: Secondary | ICD-10-CM | POA: Diagnosis not present

## 2024-02-29 ENCOUNTER — Encounter: Payer: Self-pay | Admitting: Nurse Practitioner

## 2024-02-29 ENCOUNTER — Ambulatory Visit (INDEPENDENT_AMBULATORY_CARE_PROVIDER_SITE_OTHER): Payer: Self-pay | Admitting: Nurse Practitioner

## 2024-02-29 VITALS — BP 132/82 | HR 81 | Wt 194.0 lb

## 2024-02-29 DIAGNOSIS — J302 Other seasonal allergic rhinitis: Secondary | ICD-10-CM

## 2024-02-29 DIAGNOSIS — R718 Other abnormality of red blood cells: Secondary | ICD-10-CM

## 2024-02-29 DIAGNOSIS — I10 Essential (primary) hypertension: Secondary | ICD-10-CM

## 2024-02-29 DIAGNOSIS — G4709 Other insomnia: Secondary | ICD-10-CM

## 2024-02-29 MED ORDER — FLUTICASONE PROPIONATE 50 MCG/ACT NA SUSP: 2.0000 | Freq: Every day | NASAL | 12 refills | Status: AC

## 2024-02-29 MED ORDER — OLMESARTAN-AMLODIPINE-HCTZ 40-10-25 MG PO TABS: ORAL_TABLET | ORAL | 1 refills | Status: AC

## 2024-02-29 NOTE — Assessment & Plan Note (Signed)
 Reports improved sleep pattern with Trazodone , now sleeping through the night and waking up rested. - Continue Trazodone  for sleep

## 2024-02-29 NOTE — Assessment & Plan Note (Signed)
 Red blood cells are slightly enlarged, which may indicate a B12 deficiency. No prior history of B12 deficiency reported. - Checked B12 levels to rule out deficiency.

## 2024-02-29 NOTE — Assessment & Plan Note (Signed)
 Blood pressure readings at home range from 122/78 to 140/80's, on occasion. No chest pain, shortness of breath, or dizziness. Improved sleep pattern may contribute to better blood pressure control. Sodium reduction efforts ongoing. Consideration of carvedilol if blood pressure remains elevated, but currently holding off due to improvement. - Continue current antihypertensive medication. Olmesartan -amlodipine -hydrochlorothiazide . - Encouraged continued reduction of sodium intake. - Encouraged maintaining good sleep hygiene. - Instructed to monitor blood pressure at home and report if consistently in the 140s over the next month. - Will consider adding carvedilol if blood pressure remains elevated.

## 2024-02-29 NOTE — Patient Instructions (Addendum)
 I would like you to keep watching your blood pressures at home.  Our ultimate goal is to get them around 120/80, but if we can keep it in the 120-130/85 range we are doing well!   I think once you are consistently sleeping well at night and your salt intake is better then we will see this improve.  If it stays higher than the 130's on average over the next month, please send me a mychart message and let me know.    If the flonase  by itself isn't helping with the morning allergies, you can add generic zyrtec (CETIRIZINE) or Claritin (LORATADINE) at bedtime to see if that helps.

## 2024-02-29 NOTE — Progress Notes (Signed)
 Catheline Doing, DNP, AGNP-c [facility]  [address] [place], [place] [address] [phone]  ESTABLISHED PATIENT- Chronic Health and/or Follow-Up Visit on 02/29/2024  Blood pressure 132/82, pulse 81, weight 194 lb (88 kg).   HPI: History of Present Illness John Stewart is a 78 year old male with hypertension who presents for blood pressure management and sleep improvement.  His blood pressure at home has been fluctuating, with readings typically around 140 mmHg, but sometimes as low as 122/78 mmHg. It is no longer reaching the 180s/110s, which he finds reassuring. He attributes some of the improvement to reducing salt intake, having switched to a no-sodium salt substitute. He has also discarded Mediterranean and pink salts. He is currently taking medication for his blood pressure, which he feels is working well. He monitors his heart rate with a watch, noting a current rate of 86 bpm, with a maximum of 103 bpm and a minimum of 42 bpm. He has three blood pressure cuffs at home to ensure accuracy of readings.  He reports significant improvement in his sleep quality, attributing it to medication. He now sleeps through the night and feels rested upon waking, a change from previously waking up multiple times during the night. He mentions waking up late on the day of the appointment due to improved sleep.  He experiences morning congestion despite using extra strength Mucinex , describing difficulty clearing his throat upon waking. He has not tried Zyrtec or Claritin for his symptoms.  He maintains a physically active lifestyle, walking over a mile daily, even in the rain. He occasionally experiences leg cramps at night, which he manages by stretching his toes to prevent full cramping. He consumes Propel water for hydration, which he enjoys for its electrolyte content and flavor. He is working on further reducing sodium intake to help manage his blood pressure. No chest  pain, shortness of breath, dizziness, or ankle swelling.  All ROS negative with exception of what is listed above.    PHYSICAL EXAM Physical Exam Vitals and nursing note reviewed.  Constitutional:      General: He is not in acute distress.    Appearance: Normal appearance. He is not ill-appearing.  HENT:     Head: Normocephalic.  Cardiovascular:     Rate and Rhythm: Normal rate and regular rhythm.     Pulses: Normal pulses.     Heart sounds: Normal heart sounds.  Pulmonary:     Effort: Pulmonary effort is normal.     Breath sounds: Normal breath sounds.  Musculoskeletal:     Right lower leg: No edema.     Left lower leg: No edema.  Skin:    General: Skin is warm and dry.     Capillary Refill: Capillary refill takes less than 2 seconds.  Neurological:     Mental Status: He is alert and oriented to person, place, and time.  Psychiatric:        Mood and Affect: Mood normal.     PLAN Problem List Items Addressed This Visit     Essential hypertension - Primary   Blood pressure readings at home range from 122/78 to 140/80's, on occasion. No chest pain, shortness of breath, or dizziness. Improved sleep pattern may contribute to better blood pressure control. Sodium reduction efforts ongoing. Consideration of carvedilol if blood pressure remains elevated, but currently holding off due to improvement. - Continue current antihypertensive medication. Olmesartan -amlodipine -hydrochlorothiazide . - Encouraged continued reduction of sodium intake. - Encouraged maintaining good sleep hygiene. - Instructed  to monitor blood pressure at home and report if consistently in the 140s over the next month. - Will consider adding carvedilol if blood pressure remains elevated.      Relevant Medications   Olmesartan -amLODIPine -HCTZ 40-10-25 MG TABS   Other Relevant Orders   Comprehensive metabolic panel with GFR   Seasonal allergies   Experiencing morning congestion and difficulty clearing  throat, likely due to high pollen levels. Mucinex  has been tried without significant relief. - Start Flonase  for nasal congestion. - Consider adding Zyrtec or Claritin if Flonase  is insufficient.      Relevant Medications   fluticasone  (FLONASE ) 50 MCG/ACT nasal spray   Other insomnia   Reports improved sleep pattern with Trazodone , now sleeping through the night and waking up rested. - Continue Trazodone  for sleep      Abnormal RBC   Red blood cells are slightly enlarged, which may indicate a B12 deficiency. No prior history of B12 deficiency reported. - Checked B12 levels to rule out deficiency.      Relevant Orders   Vitamin B12   CBC with Differential/Platelet       Return in about 4 months (around 06/28/2024) for Med Management 30.  John Jazel Nimmons, DNP, AGNP-c Time: 30 minutes, >50% spent counseling, care coordination, chart review, and documentation.  SABRAtime

## 2024-02-29 NOTE — Assessment & Plan Note (Signed)
 Experiencing morning congestion and difficulty clearing throat, likely due to high pollen levels. Mucinex  has been tried without significant relief. - Start Flonase  for nasal congestion. - Consider adding Zyrtec or Claritin if Flonase  is insufficient.

## 2024-03-01 ENCOUNTER — Ambulatory Visit: Payer: Self-pay | Admitting: Nurse Practitioner

## 2024-03-01 LAB — CBC WITH DIFFERENTIAL/PLATELET
Basophils Absolute: 0 x10E3/uL (ref 0.0–0.2)
Basos: 1 %
EOS (ABSOLUTE): 0.1 x10E3/uL (ref 0.0–0.4)
Eos: 4 %
Hematocrit: 40.5 % (ref 37.5–51.0)
Hemoglobin: 13.8 g/dL (ref 13.0–17.7)
Immature Grans (Abs): 0 x10E3/uL (ref 0.0–0.1)
Immature Granulocytes: 0 %
Lymphocytes Absolute: 1 x10E3/uL (ref 0.7–3.1)
Lymphs: 27 %
MCH: 34.8 pg — ABNORMAL HIGH (ref 26.6–33.0)
MCHC: 34.1 g/dL (ref 31.5–35.7)
MCV: 102 fL — ABNORMAL HIGH (ref 79–97)
Monocytes Absolute: 0.4 x10E3/uL (ref 0.1–0.9)
Monocytes: 10 %
Neutrophils Absolute: 2.3 x10E3/uL (ref 1.4–7.0)
Neutrophils: 58 %
Platelets: 231 x10E3/uL (ref 150–450)
RBC: 3.97 x10E6/uL — ABNORMAL LOW (ref 4.14–5.80)
RDW: 12 % (ref 11.6–15.4)
WBC: 3.9 x10E3/uL (ref 3.4–10.8)

## 2024-03-01 LAB — COMPREHENSIVE METABOLIC PANEL WITH GFR
ALT: 25 IU/L (ref 0–44)
AST: 43 IU/L — ABNORMAL HIGH (ref 0–40)
Albumin: 4.1 g/dL (ref 3.8–4.8)
Alkaline Phosphatase: 55 IU/L (ref 47–123)
BUN/Creatinine Ratio: 12 (ref 10–24)
BUN: 12 mg/dL (ref 8–27)
Bilirubin Total: 0.5 mg/dL (ref 0.0–1.2)
CO2: 23 mmol/L (ref 20–29)
Calcium: 9.2 mg/dL (ref 8.6–10.2)
Chloride: 101 mmol/L (ref 96–106)
Creatinine, Ser: 1.04 mg/dL (ref 0.76–1.27)
Globulin, Total: 2.1 g/dL (ref 1.5–4.5)
Glucose: 87 mg/dL (ref 70–99)
Potassium: 4 mmol/L (ref 3.5–5.2)
Sodium: 140 mmol/L (ref 134–144)
Total Protein: 6.2 g/dL (ref 6.0–8.5)
eGFR: 74 mL/min/1.73 (ref >59)

## 2024-03-01 LAB — VITAMIN B12: Vitamin B-12: 233 pg/mL (ref 232–1245)

## 2024-03-02 ENCOUNTER — Other Ambulatory Visit: Payer: Self-pay

## 2024-03-02 DIAGNOSIS — E538 Deficiency of other specified B group vitamins: Secondary | ICD-10-CM

## 2024-03-03 ENCOUNTER — Other Ambulatory Visit (INDEPENDENT_AMBULATORY_CARE_PROVIDER_SITE_OTHER)

## 2024-03-03 DIAGNOSIS — E538 Deficiency of other specified B group vitamins: Secondary | ICD-10-CM

## 2024-03-03 MED ORDER — CYANOCOBALAMIN 1000 MCG/ML IJ SOLN
1000.0000 ug | Freq: Once | INTRAMUSCULAR | Status: AC
  Administered 2024-03-03: 1000 ug via INTRAMUSCULAR

## 2024-03-03 NOTE — Progress Notes (Signed)
 Patient is in office today for a nurse visit for B12 Injection. Patient Injection was given in the  Right deltoid. Patient tolerated injection well.

## 2024-06-28 ENCOUNTER — Ambulatory Visit: Admitting: Nurse Practitioner

## 2025-02-06 ENCOUNTER — Ambulatory Visit: Payer: Self-pay
